# Patient Record
Sex: Male | Born: 1995 | Race: Black or African American | Hispanic: No | Marital: Single | Smoking: Never smoker
Health system: Southern US, Community
[De-identification: ages and names within clinical notes are randomized; demographics above are authoritative.]

## PROBLEM LIST (undated history)

## (undated) DIAGNOSIS — M79641 Pain in right hand: Secondary | ICD-10-CM

## (undated) DIAGNOSIS — S62306A Unspecified fracture of fifth metacarpal bone, right hand, initial encounter for closed fracture: Secondary | ICD-10-CM

## (undated) DIAGNOSIS — B20 Human immunodeficiency virus [HIV] disease: Secondary | ICD-10-CM

## (undated) DIAGNOSIS — K6289 Other specified diseases of anus and rectum: Secondary | ICD-10-CM

## (undated) HISTORY — PX: INGUINAL HERNIA REPAIR: SUR1180

---

## 2013-11-30 ENCOUNTER — Emergency Department (HOSPITAL_BASED_OUTPATIENT_CLINIC_OR_DEPARTMENT_OTHER): Payer: No Typology Code available for payment source

## 2013-11-30 ENCOUNTER — Encounter (HOSPITAL_BASED_OUTPATIENT_CLINIC_OR_DEPARTMENT_OTHER): Payer: Self-pay | Admitting: Emergency Medicine

## 2013-11-30 ENCOUNTER — Emergency Department (HOSPITAL_BASED_OUTPATIENT_CLINIC_OR_DEPARTMENT_OTHER)
Admission: EM | Admit: 2013-11-30 | Discharge: 2013-11-30 | Disposition: A | Discharge status: Home / Self Care | Payer: No Typology Code available for payment source | Attending: Emergency Medicine | Admitting: Emergency Medicine

## 2013-11-30 DIAGNOSIS — S0083XA Contusion of other part of head, initial encounter: Secondary | ICD-10-CM | POA: Insufficient documentation

## 2013-11-30 DIAGNOSIS — IMO0002 Reserved for concepts with insufficient information to code with codable children: Secondary | ICD-10-CM | POA: Insufficient documentation

## 2013-11-30 DIAGNOSIS — S0003XA Contusion of scalp, initial encounter: Secondary | ICD-10-CM | POA: Insufficient documentation

## 2013-11-30 DIAGNOSIS — Y92410 Unspecified street and highway as the place of occurrence of the external cause: Secondary | ICD-10-CM | POA: Insufficient documentation

## 2013-11-30 DIAGNOSIS — Y9389 Activity, other specified: Secondary | ICD-10-CM | POA: Insufficient documentation

## 2013-11-30 DIAGNOSIS — Z79899 Other long term (current) drug therapy: Secondary | ICD-10-CM | POA: Insufficient documentation

## 2013-11-30 DIAGNOSIS — S0093XA Contusion of unspecified part of head, initial encounter: Secondary | ICD-10-CM

## 2013-11-30 DIAGNOSIS — S161XXA Strain of muscle, fascia and tendon at neck level, initial encounter: Secondary | ICD-10-CM

## 2013-11-30 DIAGNOSIS — S1093XA Contusion of unspecified part of neck, initial encounter: Secondary | ICD-10-CM

## 2013-11-30 DIAGNOSIS — S139XXA Sprain of joints and ligaments of unspecified parts of neck, initial encounter: Secondary | ICD-10-CM | POA: Insufficient documentation

## 2013-11-30 MED ORDER — NAPROXEN 500 MG PO TABS: 500.0000 mg | ORAL_TABLET | Freq: Two times a day (BID) | ORAL | Status: DC
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
## 2013-11-30 NOTE — Discharge Instructions (Signed)
Your CT scan of your neck shows no injury to the cervical spine. The CT of your head shows no bleeding or injury inside your head. I am giving you instructions on head injury since you have the headache and the nausea. Return for worsening symptoms.

## 2013-12-04 NOTE — ED Provider Notes (Signed)
Medical screening examination/treatment/procedure(s) were performed by non-physician practitioner and as supervising physician I was immediately available for consultation/collaboration.   EKG Interpretation None        Dagmar HaitWilliam Marleni Gallardo, MD 12/04/13 [id number]

## 2014-06-12 HISTORY — PX: ORIF METACARPAL FRACTURE: SUR940

## 2014-07-01 ENCOUNTER — Emergency Department (HOSPITAL_COMMUNITY): Payer: BC Managed Care – PPO

## 2014-07-01 ENCOUNTER — Emergency Department (INDEPENDENT_AMBULATORY_CARE_PROVIDER_SITE_OTHER)
Admission: EM | Admit: 2014-07-01 | Discharge: 2014-07-01 | Disposition: A | Discharge status: Home / Self Care | Payer: BC Managed Care – PPO | Source: Home / Self Care

## 2014-07-01 ENCOUNTER — Encounter (HOSPITAL_COMMUNITY): Payer: Self-pay | Admitting: Emergency Medicine

## 2014-07-01 ENCOUNTER — Emergency Department (INDEPENDENT_AMBULATORY_CARE_PROVIDER_SITE_OTHER): Payer: BC Managed Care – PPO

## 2014-07-01 DIAGNOSIS — S62329A Displaced fracture of shaft of unspecified metacarpal bone, initial encounter for closed fracture: Secondary | ICD-10-CM

## 2014-07-01 MED ORDER — LIDOCAINE HCL (PF) 1 % IJ SOLN
INTRAMUSCULAR | Status: AC
  Filled 2014-07-01: qty 10

## 2014-07-01 MED ORDER — HYDROCODONE-ACETAMINOPHEN 5-325 MG PO TABS: 1.0000 | ORAL_TABLET | ORAL | Status: DC | PRN

## 2014-07-01 MED ORDER — HYDROCODONE-ACETAMINOPHEN 5-325 MG PO TABS
2.0000 | ORAL_TABLET | Freq: Once | ORAL | Status: AC
  Administered 2014-07-01: 2 via ORAL

## 2014-07-01 MED ORDER — HYDROCODONE-ACETAMINOPHEN 5-325 MG PO TABS
ORAL_TABLET | ORAL | Status: AC
  Filled 2014-07-01: qty 2

## 2014-07-01 NOTE — Consult Note (Signed)
  See full consult# [id number] Francisco Ryser MD

## 2014-07-01 NOTE — ED Provider Notes (Signed)
CSN: [id number]     Arrival date & time 07/01/14  1049 History   First MD Initiated Contact with Patient 07/01/14 1105     Chief Complaint  Patient presents with  . Hand Injury   (Consider location/radiation/quality/duration/timing/severity/associated sxs/prior Treatment) HPI Comments: C/O right hand pain after punching a wall in anger last PM.   History reviewed. No pertinent past medical history. Past Surgical History  Procedure Laterality Date  . Hernia repair     History reviewed. No pertinent family history. History  Substance Use Topics  . Smoking status: Never Smoker   . Smokeless tobacco: Not on file  . Alcohol Use: Yes    Review of Systems  Constitutional: Negative.   Respiratory: Negative for cough and shortness of breath.   Cardiovascular: Negative for chest pain.  Gastrointestinal: Negative.   Musculoskeletal:       As per HPI  Skin: Negative.   Neurological: Negative.     Allergies  Review of patient's allergies indicates no known allergies.  Home Medications   Prior to Admission medications   Medication Sig Start Date End Date Taking? Authorizing Provider  fexofenadine (ALLEGRA) 180 MG tablet Take 180 mg by mouth daily.    Historical Provider, MD  naproxen (NAPROSYN) 500 MG tablet Take 1 tablet (500 mg total) by mouth 2 (two) times daily. 11/30/13   Hope Orlene OchM Neese, NP   BP 125/79  Pulse 61  Temp(Src) 98.3 F (36.8 C) (Oral)  Resp 16  SpO2 95% Physical Exam  Nursing note and vitals reviewed. Constitutional: He is oriented to person, place, and time. He appears well-developed and well-nourished.  HENT:  Head: Normocephalic and atraumatic.  Eyes: EOM are normal. Left eye exhibits no discharge.  Neck: Normal range of motion. Neck supple.  Pulmonary/Chest: Effort normal. No respiratory distress.  Musculoskeletal:  Swelling and tenderness to the R. Hand, greatest over the 4th and 5th MT's.  No deformity. Distal N/V, M/S intact. Digital flexion and  extension intact. Cap reill <2 sec.  Wrist nontender with full ROM.  Radial pulse 2+  Neurological: He is alert and oriented to person, place, and time. No cranial nerve deficit.  Skin: Skin is warm and dry.  Psychiatric: He has a normal mood and affect.    ED Course  Procedures (including critical care time) Labs Review Labs Reviewed - No data to display  Imaging Review Dg Hand Complete Right  07/01/2014   CLINICAL DATA:  Patient reports punching a wall last night now with persistent pain and swelling over the third through fifth metacarpal regions  EXAM: RIGHT HAND - COMPLETE 3+ VIEW  COMPARISON:  None.  FINDINGS: There is an acute angulated fracture of the distal shaft of the fifth metacarpal. The first through fourth metacarpals are intact as are the phalanges. There is overlying soft tissue swelling. The carpometacarpal, metacarpophalangeal, and interphalangeal joints are intact.  IMPRESSION: There is an angulated fracture of the distal third of the shaft of the fifth metacarpal.   Electronically Signed   By: [facility] [place]  [place]   On: 07/01/2014 11:55     MDM   1. Shaft of metacarpal bone, fracture, closed, initial encounter    Norco 5 mg x 2 tabs po once. Spoke with Dr. Amanda PeaGramig at [phone] and advised of the xray interpretation. [facility]. Post reduction films ordered after management per Dr. Amanda PeaGramig. Dr. Brunetta GeneraGramigs office to call pt to arrange for surgery next week Care instructions  Pain management per Dr. Amanda PeaGramig.     Hayden Rasmussenavid Leslyn Monda, NP 07/01/14 1352

## 2014-07-01 NOTE — Discharge Instructions (Signed)
Cast or Splint Care °Casts and splints support injured limbs and keep bones from moving while they heal. It is important to care for your cast or splint at home.   °HOME CARE INSTRUCTIONS °· Keep the cast or splint uncovered during the drying period. It can take 24 to 48 hours to dry if it is made of plaster. A fiberglass cast will dry in less than 1 hour. °· Do not rest the cast on anything harder than a pillow for the first 24 hours. °· Do not put weight on your injured limb or apply pressure to the cast until your health care provider gives you permission. °· Keep the cast or splint dry. Wet casts or splints can lose their shape and may not support the limb as well. A wet cast that has lost its shape can also create harmful pressure on your skin when it dries. Also, wet skin can become infected. °¨ Cover the cast or splint with a plastic bag when bathing or when out in the rain or snow. If the cast is on the trunk of the body, take sponge baths until the cast is removed. °¨ If your cast does become wet, dry it with a towel or a blow dryer on the cool setting only. °· Keep your cast or splint clean. Soiled casts may be wiped with a moistened cloth. °· Do not place any hard or soft foreign objects under your cast or splint, such as cotton, toilet paper, lotion, or powder. °· Do not try to scratch the skin under the cast with any object. The object could get stuck inside the cast. Also, scratching could lead to an infection. If itching is a problem, use a blow dryer on a cool setting to relieve discomfort. °· Do not trim or cut your cast or remove padding from inside of it. °· Exercise all joints next to the injury that are not immobilized by the cast or splint. For example, if you have a long leg cast, exercise the hip joint and toes. If you have an arm cast or splint, exercise the shoulder, elbow, thumb, and fingers. °· Elevate your injured arm or leg on 1 or 2 pillows for the first 1 to 3 days to decrease  swelling and pain. It is best if you can comfortably elevate your cast so it is higher than your heart. °SEEK MEDICAL CARE IF:  °· Your cast or splint cracks. °· Your cast or splint is too tight or too loose. °· You have unbearable itching inside the cast. °· Your cast becomes wet or develops a soft spot or area. °· You have a bad smell coming from inside your cast. °· You get an object stuck under your cast. °· Your skin around the cast becomes red or raw. °· You have new pain or worsening pain after the cast has been applied. °SEEK IMMEDIATE MEDICAL CARE IF:  °· You have fluid leaking through the cast. °· You are unable to move your fingers or toes. °· You have discolored (blue or white), cool, painful, or very swollen fingers or toes beyond the cast. °· You have tingling or numbness around the injured area. °· You have severe pain or pressure under the cast. °· You have any difficulty with your breathing or have shortness of breath. °· You have chest pain. °Document Released: 08/26/2000 Document Revised: 06/19/2013 Document Reviewed: 03/07/2013 °ExitCare® Patient Information ©2015 [employer]. This information is not intended to replace advice given to you by your health care   provider. Make sure you discuss any questions you have with your health care provider.  Boxer's Fracture You have a break (fracture) of the fifth metacarpal bone. This is commonly called a boxer's fracture. This is the bone in the hand where the little finger attaches. The fracture is in the end of that bone, closest to the little finger. It is usually caused when you hit an object with a clenched fist. Often, the knuckle is pushed down by the impact. Sometimes, the fracture rotates out of position. A boxer's fracture will usually heal within 6 weeks, if it is treated properly and protected from re-injury. Surgery is sometimes needed. A cast, splint, or bulky hand dressing may be used to protect and immobilize a boxer's fracture. Do  not remove this device or dressing until your caregiver approves. Keep your hand elevated, and apply ice packs for 15-20 minutes every 2 hours, for the first 2 days. Elevation and ice help reduce swelling and relieve pain. See your caregiver, or an orthopedic specialist, for follow-up care within the next 10 days. This is to make sure your fracture is healing properly. Document Released: 08/29/2005 Document Revised: 11/21/2011 Document Reviewed: 02/16/2007 [facility] Patient Information 2015 [place], [place]. This information is not intended to replace advice given to you by your health care provider. Make sure you discuss any questions you have with your health care provider.  Closed Reduction for Metacarpal Fracture or Dislocation, Care After Refer to this sheet in the next few weeks. These instructions provide you with information on caring for yourself after your procedure. Your health care provider may also give you more specific instructions. Your treatment has been planned according to current medical practices, but problems sometimes occur. Call your health care provider if you have any problems or questions after your procedure. HOME CARE INSTRUCTIONS   Only take medicines as directed by your health care provider.  Keep the injured area elevated above the level of your heart while you are sleeping or sitting down.  Apply ice to the injured area, twice per day, for 2-3 days:  Put ice in a plastic bag.  Place a towel between your skin and the bag.  Leave the ice on for 15 minutes.  Keep your splint clean and dry. Cover your splint with a bag while you shower. SEEK MEDICAL CARE IF:  Your pain does not get better or gets worse.  You develop numbness or tingling.  Your fingers turn white or bluish. Document Released: 05/23/2012 Document Revised: 01/13/2014 Document Reviewed: 05/23/2012 [facility] Patient Information 2015 [place], [place]. This information is not intended to replace advice  given to you by your health care provider. Make sure you discuss any questions you have with your health care provider.  Hand Fracture, Fifth Metacarpal The small metacarpal is the bone at the base of the little finger between the knuckle and the wrist. A fracture is a break in that bone. One of the fractures that is common to this bone is called a Boxer's Fracture. TREATMENT These fractures can be treated with:   Reduction (bones moved back into place), then pinned through the skin to maintain the position, and then casted for about 6 weeks or as your caregiver determines necessary.  ORIF (open reduction and internal fixation) - the fracture site is opened and the bone pieces are fixed into place with pins and then casted for approximately 6 weeks or as your caregiver determines necessary. Your caregiver will discuss the type of fracture you have and  the treatment that should be best for that problem. If surgery is the treatment of choice, the following is information for you to know, and also let your caregiver know about prior to surgery.  LET YOUR CAREGIVER KNOW ABOUT:  Allergies.  Medications taken including herbs, eye drops, over the counter medications, and creams.  Use of steroids (by mouth or creams).  Previous problems with anesthetics or novocaine.  Possibility of pregnancy, if this applies.  History of blood clots (thrombophlebitis).  History of bleeding or blood problems.  Previous surgery.  Other health problems. AFTER THE PROCEDURE After surgery, you will be taken to the recovery area where a nurse will watch and check your progress. Once you're awake, stable, and taking fluids well, barring other problems you'll be allowed to go home. Once home an ice pack applied to your operative site may help with discomfort and keep the swelling down. HOME CARE INSTRUCTIONS   Follow your caregiver's instructions as to activities, exercises, physical therapy, and driving a  car.  Daily exercise is helpful for maintaining range of motion (movement and mobility) and strength. Exercise as instructed.  To lessen swelling, keep the injured hand elevated above the level of your heart as much as possible.  Apply ice to the injury for 15-20 minutes each hour while awake for the first 2 days. Put the ice in a plastic bag and place a thin towel between the bag of ice and your cast.  Move the fingers of your casted hand at least several times a day.  If a plaster or fiberglass cast was applied:  Do not try to scratch the skin under the cast using a sharp or pointed object.  Check the skin around the cast every day. You may put lotion on red or sore areas.  Keep your cast dry. Your cast can be protected during bathing with a plastic bag. Do not put your cast into the water.  If a plaster splint was applied:  Wear the splint for as long as directed by your caregiver or until seen for follow-up examination.  Do not get your splint wet. Protect it during bathing with a plastic bag.  You may loosen the elastic bandage around the splint if your fingers start to get numb, tingle, get cold or turn blue.  Do not put pressure on your cast or splint; this may cause it to break. Especially, do not lean plaster casts on hard surfaces for 24 hours after application.  Take medications as directed by your caregiver.  Only take over-the-counter or prescription medicines for pain, discomfort, or fever as directed by your caregiver.  Follow all instructions for physician referrals, physical therapy, and rehabilitation. Any delay in obtaining necessary care could result in permanent injury, disability and chronic pain. SEEK MEDICAL CARE IF:   Increased bleeding (more than a small spot) from the wound or from beneath your cast or splint if there is a wound beneath the cast from surgery.  Redness, swelling, or increasing pain in the wound or from beneath your cast or splint.  Pus  coming from wound or from beneath your cast or splint.  An unexplained oral temperature above 102 F (38.9 C) develops.  A foul smell coming from the wound or dressing or from beneath your cast or splint.  You are unable to move your little finger. SEEK IMMEDIATE MEDICAL CARE IF:  You develop a rash, have difficulty breathing, or have any allergy problems. If you do not have a window  in your cast for observing the wound, a discharge or minor bleeding may show up as a stain on the outside of your cast. Report these findings to your caregiver. MAKE SURE YOU:   Understand these instructions.  Will watch your condition.  Will get help right away if you are not doing well or get worse. Document Released: 12/05/2000 Document Revised: 11/21/2011 Document Reviewed: 04/17/2008 [facility] Patient Information 2015 [place]. This information is not intended to replace advice given to you by your health care provider. Make sure you discuss any questions you have with your health care provider.

## 2014-07-01 NOTE — ED Notes (Signed)
Reports injuring right hand last night after punching a wall.  Having pain and swelling in fingers.  Pain is felt on top of hand near the four and fifth fingers.

## 2014-07-02 NOTE — Consult Note (Signed)
[name]:  Vos, Calvin             ACCOUNT NO.:  [id number]  MEDICAL RECORD NO.:  [id number]  LOCATION:  [facility]06                         FACILITY:  [facility]  PHYSICIAN:  Dionne AnoWilliam M. Lorae Roig, M.D.DATE OF BIRTH:  03/13/1996  DATE OF CONSULTATION: DATE OF DISCHARGE:  07/01/2014                                CONSULTATION   I had the pleasure to see [facility].  The patient had a hand injury.  Late last night, he had an injury to the fifth metacarpal.  He sustained a fifth metacarpal shaft fracture with severe displacement, swelling, and abnormality.  I have discussed him in his upper extremity predicament.  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  He is a very nice young man.  PAST SURGICAL HISTORY:  Hernia repair.  PAST MEDICAL HISTORY:  None significant.  SOCIAL HISTORY:  He does not smoke.  He occasionally enjoys an alcoholic beverage.  REVIEW OF SYSTEMS:  Negative.  ALLERGIES:  The patient has no known drug allergies.  HOME MEDICINES:  Naproxen p.r.n. and Allegra.  PHYSICAL EXAMINATION:  GENERAL:  Pleasant male, alert and oriented and in no acute distress. VITAL SIGNS:  Stable.  The patient has full range of motion about the lower extremities. ABDOMEN:  Nontender, nondistended. HEENT:  Within normal limits. CHEST:  Clear with equal expansion. HEART:  Regular rate. EXTREMITIES:  Right upper extremity has small abrasion, no open injury and a tremendously swollen area.  There is no evidence of infection, dystrophy, or vascular compromise.  His x-rays were reviewed, which do show a displaced fifth metacarpal shaft fracture, apex dorsal angulation.  I discussed with the patient all issues.  Following this, I consented him verbally for a ulnar nerve block.  A 20 mL of lidocaine 1% without epinephrine.  I performed an ulnar nerve and dorsal ulnar sensory block.  Then, I scrubbed the hand with Hibiclens scrub and paint,  washed him and placed Xeroform over the abrasions followed by closed reduction and application of short-arm cast, well-molded with 3-point mold.  Following this, 4 views of the hand showed much improved alignment, but he still has some degree of apex dorsal angulation and due to this and his tendency with a transverse fracture for progressive angulation, I would recommend pinning/fixation definitively.  I have attempted to call his mother at telephone number [phone]. Her name is Bollisa.  I discussed this with his mother who lives in [place] and we are going to set up a time for elective ORIF.  At present time, he is alert and oriented and in no acute distress.  We discharged him on OxyIR 5 mg 1-2 q.4-6 hours p.r.n. pain p.o., Peri- Colace, vitamin C, ice elevate, keep the area clean and dry and I have his phone number to arrange for proper followup and definitive fixation in a mutually convenient time.  It was pleasure to see him today and discussed him his care.  We look forward to participating in his postinjury recovery process and do recommend definitive surgical fixation given the fact that he is a young, active male and right-hand dominant.  FINAL DIAGNOSIS:  Status post closed reduction  under ulnar nerve block of a right fifth metacarpal fracture.     Dionne AnoWilliam M. Amanda PeaGramig, M.D.     [facility]  D:  07/01/2014  T:  07/02/2014  Job:  [id number]

## 2014-07-04 NOTE — ED Provider Notes (Signed)
Medical screening examination/treatment/procedure(s) were performed by resident physician or non-physician practitioner and as supervising physician I was immediately available for consultation/collaboration.   KINDL,JAMES DOUGLAS MD.   James D Kindl, MD 07/04/14 1005 

## 2015-07-26 ENCOUNTER — Emergency Department (HOSPITAL_BASED_OUTPATIENT_CLINIC_OR_DEPARTMENT_OTHER)
Admission: EM | Admit: 2015-07-26 | Discharge: 2015-07-26 | Disposition: A | Discharge status: Home / Self Care | Payer: BLUE CROSS/BLUE SHIELD | Attending: Emergency Medicine | Admitting: Emergency Medicine

## 2015-07-26 ENCOUNTER — Encounter (HOSPITAL_BASED_OUTPATIENT_CLINIC_OR_DEPARTMENT_OTHER): Payer: Self-pay | Admitting: Emergency Medicine

## 2015-07-26 DIAGNOSIS — K921 Melena: Secondary | ICD-10-CM | POA: Insufficient documentation

## 2015-07-26 DIAGNOSIS — K6289 Other specified diseases of anus and rectum: Secondary | ICD-10-CM | POA: Diagnosis present

## 2015-07-26 DIAGNOSIS — K602 Anal fissure, unspecified: Secondary | ICD-10-CM

## 2015-07-26 DIAGNOSIS — Z9889 Other specified postprocedural states: Secondary | ICD-10-CM | POA: Diagnosis not present

## 2015-07-26 DIAGNOSIS — K600 Acute anal fissure: Secondary | ICD-10-CM | POA: Insufficient documentation

## 2015-07-26 MED ORDER — LIDOCAINE HCL 2 % EX GEL
1.0000 "application " | Freq: Once | CUTANEOUS | Status: AC
  Administered 2015-07-26: 1
  Filled 2015-07-26: qty 20

## 2015-07-26 MED ORDER — LIDOCAINE 5 % EX OINT: 1.0000 "application " | TOPICAL_OINTMENT | Freq: Four times a day (QID) | CUTANEOUS | Status: DC | PRN

## 2015-07-26 MED ORDER — LIDOCAINE VISCOUS 2 % MT SOLN: 20.0000 mL | Freq: Once | OROMUCOSAL | Status: DC

## 2015-07-26 MED ORDER — DOCUSATE SODIUM 100 MG PO CAPS
100.0000 mg | ORAL_CAPSULE | Freq: Once | ORAL | Status: AC
  Administered 2015-07-26: 100 mg via ORAL
  Filled 2015-07-26: qty 1

## 2015-07-26 MED ORDER — SENNOSIDES-DOCUSATE SODIUM 8.6-50 MG PO TABS: 2.0000 | ORAL_TABLET | Freq: Every evening | ORAL | Status: DC | PRN

## 2015-07-26 NOTE — ED Notes (Signed)
C/o rectal pain, constant, "cannot stand", worse with sitting and BMs, reports constipation, scant blood noted on tissue, hemorrhoids, (denies: nvd, fever, abd or back pain, numbness or tingling, or other sx), describes diet as poor, well hydrated (pt is a student-athlete), lifts weights. Pt alert, NAD, calm, interactive, [facility].

## 2015-07-26 NOTE — ED Provider Notes (Signed)
CSN: [id number]     Arrival date & time 07/26/15  2246 History   First MD Initiated Contact with Patient 07/26/15 2250     Chief Complaint  Patient presents with  . Rectal Pain     (Consider location/radiation/quality/duration/timing/severity/associated sxs/prior Treatment) HPI  Blood pressure 141/93, pulse 68, temperature 98 F (36.7 C), temperature source Oral, resp. rate 18, height 6\' 3"  (1.905 m), weight 175 lb (79.379 kg), SpO2 100 %.  Liz MaladyDreshaun Delight OvensJarmon is a 19 y.o. male complaining of rectal pain course of 4 days. Worse with bowel movements. He has blood-streaked stool. Patient has been using Preparation H with little relief. Has firm stool. No fever, chills, abdominal pain, similar prior episodes.  History reviewed. No pertinent past medical history. Past Surgical History  Procedure Laterality Date  . Hernia repair     History reviewed. No pertinent family history. Social History  Substance Use Topics  . Smoking status: Never Smoker   . Smokeless tobacco: None  . Alcohol Use: Yes    Review of Systems  10 systems reviewed and found to be negative, except as noted in the HPI.   Allergies  Review of patient's allergies indicates no known allergies.  Home Medications   Prior to Admission medications   Medication Sig Start Date End Date Taking? Authorizing Provider  fexofenadine (ALLEGRA) 180 MG tablet Take 180 mg by mouth daily.    Historical Provider, MD  naproxen (NAPROSYN) 500 MG tablet Take 1 tablet (500 mg total) by mouth 2 (two) times daily. 11/30/13   Hope Orlene OchM Neese, NP   BP 141/93 mmHg  Pulse 68  Temp(Src) 98 F (36.7 C) (Oral)  Resp 18  Ht 6\' 3"  (1.905 m)  Wt 175 lb (79.379 kg)  BMI 21.87 kg/m2  SpO2 100% Physical Exam  Constitutional: He is oriented to person, place, and time. He appears well-developed and well-nourished. No distress.  HENT:  Head: Normocephalic.  Eyes: Conjunctivae and EOM are normal.  Cardiovascular: Normal rate.     Pulmonary/Chest: Effort normal. No stridor.  Genitourinary:     Digital rectal exam chaperoned by nurse Jonny RuizJohn:  Small condyloma scattered    Musculoskeletal: Normal range of motion.  Neurological: He is alert and oriented to person, place, and time.  Psychiatric: He has a normal mood and affect.  Nursing note and vitals reviewed.   ED Course  Procedures (including critical care time) Labs Review Labs Reviewed - No data to display  Imaging Review No results found. I have personally reviewed and evaluated these images and lab results as part of my medical decision-making.   EKG Interpretation None      MDM   Final diagnoses:  Anal fissure    Filed Vitals:   07/26/15 2253  BP: 141/93  Pulse: 68  Temp: 98 F (36.7 C)  TempSrc: Oral  Resp: 18  Height: 6\' 3"  (1.905 m)  Weight: 175 lb (79.379 kg)  SpO2: 100%    Medications  lidocaine (XYLOCAINE) 2 % jelly 1 application (1 application Other Given 07/26/15 2324)  docusate sodium (COLACE) capsule 100 mg (100 mg Oral Given 07/26/15 2339)    Rosilyn MingsDreshaun Snider is 19 y.o. male presenting with painful rectum and blood streaked stool. PE with fissure and small condyloma. General surgery referring given.  Evaluation does not show pathology that would require ongoing emergent intervention or inpatient treatment. Pt is hemodynamically stable and mentating appropriately. Discussed findings and plan with patient/guardian, who agrees with care plan. All questions answered. Return precautions  discussed and outpatient follow up given.   Discharge Medication List as of 07/26/2015 11:35 PM    START taking these medications   Details  lidocaine (XYLOCAINE) 5 % ointment Apply 1 application topically 4 (four) times daily as needed., Starting 07/26/2015, Until Discontinued, Print    senna-docusate (SENOKOT-S) 8.6-50 MG tablet Take 2 tablets by mouth at bedtime as needed for mild constipation., Starting 07/26/2015, Until Discontinued,  Print             Wynetta Emery, PA-C 07/26/15 2354  Paula Libra, MD 07/27/15 [phone]

## 2015-07-26 NOTE — Discharge Instructions (Signed)
Do not hesitate to return to the emergency room for any new, worsening or concerning symptoms.  Please obtain primary care using resource guide below. Let them know that you were seen in the emergency room and that they will need to obtain records for further outpatient management.     Anal Fissure, Adult An anal fissure is a small tear or crack in the skin around the anus. Bleeding from a fissure usually stops on its own within a few minutes. However, bleeding will often occur again with each bowel movement until the crack heals. CAUSES This condition may be caused by:  Passing large, hard stool (feces).  Frequent diarrhea.  Constipation.  Inflammatory bowel disease (Crohn disease or ulcerative colitis).  Infections.  Anal sex. SYMPTOMS Symptoms of this condition include:  Bleeding from the rectum.  Small amounts of blood seen on your stool, on toilet paper, or in the toilet after a bowel movement.  Painful bowel movements.  Itching or irritation around the anus. DIAGNOSIS A health care provider may diagnose this condition by closely examining the anal area. An anal fissure can usually be seen with careful inspection. In some cases, a rectal exam may be performed, or a short tube (anoscope) may be used to examine the anal canal. TREATMENT Treatment for this condition may include:  Taking steps to avoid constipation. This may include making changes to your diet, such as increasing your intake of fiber or fluid.  Taking fiber supplements. These supplements can soften your stool to help make bowel movements easier. Your health care provider may also prescribe a stool softener if your stool is often hard.  Taking sitz baths. This may help to heal the tear.  Using medicated creams or ointments. These may be prescribed to lessen discomfort. HOME CARE INSTRUCTIONS Eating and Drinking  Avoid foods that may be constipating, such as bananas and dairy products.  Drink enough  fluid to keep your urine clear or pale yellow.  Maintain a diet that is high in fruits, whole grains, and vegetables. General Instructions  Keep the anal area as clean and dry as possible.  Take sitz baths as told by your health care provider. Do not use soap in the sitz baths.  Take over-the-counter and prescription medicines only as told by your health care provider.  Use creams or ointments only as told by your health care provider.  Keep all follow-up visits as told by your health care provider. This is important. SEEK MEDICAL CARE IF:  You have more bleeding.  You have a fever.  You have diarrhea that is mixed with blood.  You continue to have pain.  Your problem is getting worse rather than better.   This information is not intended to replace advice given to you by your health care provider. Make sure you discuss any questions you have with your health care provider.   Document Released: 08/29/2005 Document Revised: 05/20/2015 Document Reviewed: 11/24/2014 Elsevier Interactive Patient Education 2016 [employer] Emergency Department Resource Guide 1) Find a Doctor and Pay Out of Pocket Although you won't have to find out who is covered by your insurance plan, it is a good idea to ask around and get recommendations. You will then need to call the office and see if the doctor you have chosen will accept you as a new patient and what types of options they offer for patients who are self-pay. Some doctors offer discounts or will set up payment plans for their patients who do not have  insurance, but you will need to ask so you aren't surprised when you get to your appointment.  2) Contact Your Local Health Department Not all health departments have doctors that can see patients for sick visits, but many do, so it is worth a call to see if yours does. If you don't know where your local health department is, you can check in your phone book. The CDC also has a tool to help you  locate your state's health department, and many state websites also have listings of all of their local health departments.  3) Find a Walk-in Clinic If your illness is not likely to be very severe or complicated, you may want to try a walk in clinic. These are popping up all over the country in pharmacies, drugstores, and shopping centers. They're usually staffed by nurse practitioners or physician assistants that have been trained to treat common illnesses and complaints. They're usually fairly quick and inexpensive. However, if you have serious medical issues or chronic medical problems, these are probably not your best option.  No Primary Care Doctor: - Call Health Connect at  [phone] - they can help you locate a primary care doctor that  accepts your insurance, provides certain services, etc. - Physician Referral Service- [phone]  Chronic Pain Problems: Organization         Address  Phone   Notes  [facility]  [phone] Patients need to be referred by their primary care doctor.   Medication Assistance: Organization         Address  Phone   Notes  [facility] Medication [facility] [address] [place], [place] [address] [phone] --Must be a resident of [facility] -- Must have NO insurance coverage whatsoever (no Medicaid/ Medicare, etc.) -- The pt. MUST have a primary care doctor that directs their care regularly and follows them in the community   Med[facility]ist  [phone]   [employer]  [phone]    Agencies that provide inexpensive medical care: Organization         Address  Phone   Notes  [facility]  [phone]   [facility]    [phone]   [facility] [address] [place], [place] [address] [phone]   [facility] [address] [place] [place] [phone]   Planned Parenthood    [phone]   [facility]    [phone]   [facility]  [address], [place] Phone:  [phone], Fax:  [phone] Hours of Operation:  9 am - 6 pm, M-F.  Also accepts Medicaid/Medicare and self-pay.  [facility]  [address], [place] Phone: [phone], Fax: [phone]. Hours of Operation:  8:30 am - 5:30 pm, M-F.  Also accepts Medicaid and self-pay.  [facility] [place] [address], [place] Phone: [phone]   [facility] [address] Natasha Bence [place], [place] [phone], Ext. [phone] Mondays & Thursdays: 7-9 AM.  First 15 patients are seen on a first come, first serve basis.    Medicaid-accepting [facility] Providers:  Organization         Address  Phone   Notes  [facility] [address], [place] [phone] Also accepts self-pay patients.  [facility] [address]  Dolores Patty [place], [place]  [phone]   [place], [address], [place] [phone]   [place] [address] [place] [phone]   Lucianne Lei [address], [place]   [phone] Only accepts [place] Access [place] patients after they have their name applied to their card.   Self-Pay (no insurance) in [facility]:  Organization         Address  Phone   Notes  Sickle Cell Patients, [facility] [place] [phone]   [facility] [place] [phone]   [facility] [place]  [place], [place], [place] [phone]   [facility]/Dr. Osei-Bonsu  [address] [place] or [address], [place] [phone] Phone number for both [place] and [place] locations is the same.  [facility] and [facility] [address]  [place] [phone]   [facility] [address] [place] or [address] [phone]   [facility] [address], [place] [phone], phone; [phone], fax Sees patients 1st and 3rd Saturday of every month.  Must not qualify for public or private insurance (i.e. Medicaid, Medicare, [place] Health Choice, Veterans' Benefits)  Household income should be no more than 200% of the poverty level The clinic cannot treat you if you are pregnant or think you are pregnant  Sexually transmitted diseases are not treated at the clinic.    Dental Care: Organization         Address  Phone  Notes  [facility] Department of [facility] [place] [phone] Accepts children up to age 52 who are enrolled in [place] or [place]; pregnant women with a Medicaid card; and children who have applied for Medicaid or [place] Health Choice, but were declined, whose parents can pay a reduced fee at time of service.  [facility] of [facility]  [address], [place] [phone] Accepts children up to age 9 who are enrolled in [place] or [place]; pregnant women with a Medicaid card; and children who have applied for Medicaid or [place] Health Choice, but were declined, whose parents can pay a reduced fee at time of service.  [facility] Adult [facility] Access PROGRAM  [place] [phone] Patients are seen by appointment only. Walk-ins are not accepted. [place] will see patients 45 years of age and older. Monday - Tuesday (8am-5pm) Most Wednesdays (8:30-5pm) $30 per visit, cash only  [facility] Access PROGRAM  [address], [place] [facility] [phone] Patients are seen by appointment only. Walk-ins are not accepted. [place] will see patients 59 years of age and older. One Wednesday Evening  (Monthly: Volunteer Based).  $30 per visit, cash only  [place]  [phone] for adults; Children under age 92, call Graduate Pediatric Dentistry at [phone]. Children aged 38-14, please call [phone] to request a pediatric application.  Dental services are provided in all areas of dental care including fillings, crowns and bridges, complete and partial dentures, implants, gum treatment, root canals, and extractions. Preventive care is also provided. Treatment is provided to both adults and children. Patients are selected via  a lottery and there is often a waiting list.   [facility] [address] [place]  [phone] [email or web address]   [facility] [address] [place], [place] [phone], Ext. [phone] Second and Fourth Thursday of each month, opens at 6:30 AM; Clinic ends at 9 AM.  Patients are seen on a first-come first-served basis, and a limited number are seen during each clinic.   [facility]  [address] Hillard Danker [place], [place] [phone]   Eligibility Requirements You must have lived in [place], [place], or [place] counties for at least the last three months.   You cannot be eligible for state or federal sponsored [employer], including [employer], [place], or [employer].   You generally cannot be eligible for healthcare insurance through your employer.    How to apply: Eligibility screenings are held every Tuesday and Wednesday afternoon from 1:00 pm until 4:00 pm. You do not need an appointment for the interview!  [facility] [address] [place], [place]   [place]  [place] Department  [place]  [phone]    Behavioral Health Resources in the Community: Intensive Outpatient Programs Organization         Address  Phone  Notes  [facility] [place]. [address] [place], [place] [phone]   [facility] Outpatient [address] [place], [place]   ADS: Alcohol & Drug S[facility] [address] [place], [place]   [place] [address]  [place], [place] or [phone]   Substance Abuse Resources Organization         Address  Phone  Notes  Alcohol and Drug Services  [phone]   [place]  [phone]   [facility] [place]   Chinita Pester  [phone]   Residential & Outpatient Substance Abuse Program  [phone]   Psychological Services Organization         Address  Phone  Notes  [facility] [place]  [place]  [phone]   [place] [address] [place] or [phone]    Mobile Crisis Teams Organization         Address  Phone  Notes  [facility], Mobile Crisis Care Unit  [phone]   [facility]  [address] [place], [place]   [place] Levels [address] [place] [phone]    Self-Help/Support Groups Organization         Address  Phone             Notes  [place]. of [place] - variety of support groups  [place] [facility] [place] Call for more information  Narcotics Anonymous (NA), [facility] [address], [employer] [place]  2 meetings at this location   Special educational needs teacher         Address  Phone  Notes  ASAP Residential Treatment [place],    [place]  [phone]   [facility]  [address] [place] [phone], [place], [place]   [place], [place] [phone] Admissions: 8am-3pm M-F  Incentives Substance [place] [address],    [place], [place] [phone]   [facility] [place] [address],  [place], [place]   [facility] [address],  [place], [place]   [facility] - Intensive Outpatient [address] Kristeen [address], [place], [place]   [facility] [place].,  [place], [place] [phone] or [phone]   [facility] Services (RTS) [address] [place], [place] Accepts Medicaid  [facility] [place] [address],  [place] [phone] Substance Abuse/Addiction Treatment   [facility] Organization         Address  Phone  Notes  [facility]  [phone]   Domenic Schwab, PhD [address] Arlis Porta [place], [place]   [phone] or [phone]   Lyman Niederwald [place], [place] [phone]   [facility] [address], [place], [place] [phone] Insurance/Medicaid/sponsorship through [facility] and Families [address]                                    [place], [place] [phone] [place] [address], [place] [phone]    Dr. Adele Schilder  [phone]   [facility] Dept. 1) [address] [place] 2) [place] 3)  [place] [address], [place] [phone]  [phone]   [place] [phone] or [phone] (After Hours)

## 2015-07-26 NOTE — ED Notes (Signed)
"  feel about the same", denies questions or needs, given Rx x2

## 2015-07-26 NOTE — ED Notes (Signed)
Pt in c/o rectal pain on bowel movements, sitting, and any contact. Has also noted mild bleeding on bowel movements.

## 2015-07-26 NOTE — ED Notes (Signed)
[facility]

## 2015-11-05 ENCOUNTER — Ambulatory Visit: Payer: Self-pay | Admitting: *Deleted

## 2015-11-05 ENCOUNTER — Ambulatory Visit (INDEPENDENT_AMBULATORY_CARE_PROVIDER_SITE_OTHER): Payer: Self-pay

## 2015-11-05 DIAGNOSIS — B20 Human immunodeficiency virus [HIV] disease: Secondary | ICD-10-CM

## 2015-11-05 DIAGNOSIS — Z23 Encounter for immunization: Secondary | ICD-10-CM

## 2015-11-05 DIAGNOSIS — F4323 Adjustment disorder with mixed anxiety and depressed mood: Secondary | ICD-10-CM

## 2015-11-05 LAB — COMPLETE METABOLIC PANEL WITH GFR
ALT: 22 U/L (ref 9–46)
AST: 21 U/L (ref 10–40)
Albumin: 3.5 g/dL — ABNORMAL LOW (ref 3.6–5.1)
Alkaline Phosphatase: 99 U/L (ref 40–115)
BUN: 9 mg/dL (ref 7–25)
CALCIUM: 8.8 mg/dL (ref 8.6–10.3)
CHLORIDE: 103 mmol/L (ref 98–110)
CO2: 29 mmol/L (ref 20–31)
Creat: 0.92 mg/dL (ref 0.60–1.35)
GFR, Est African American: >89 mL/min (ref >=60)
GFR, Est Non African American: >89 mL/min (ref >=60)
Glucose, Bld: 73 mg/dL (ref 65–99)
POTASSIUM: 3.8 mmol/L (ref 3.5–5.3)
Sodium: 138 mmol/L (ref 135–146)
Total Bilirubin: 0.4 mg/dL (ref 0.2–1.2)
Total Protein: 8.1 g/dL (ref 6.1–8.1)

## 2015-11-05 LAB — CBC WITH DIFFERENTIAL/PLATELET
Basophils Absolute: 0 10*3/uL (ref 0.0–0.1)
Basophils Relative: 0 % (ref 0–1)
EOS ABS: 0.1 10*3/uL (ref 0.0–0.7)
Eosinophils Relative: 2 % (ref 0–5)
HCT: 39.7 % (ref 39.0–52.0)
HEMOGLOBIN: 12.9 g/dL — AB (ref 13.0–17.0)
LYMPHS ABS: 1.8 10*3/uL (ref 0.7–4.0)
Lymphocytes Relative: 29 % (ref 12–46)
MCH: 26.9 pg (ref 26.0–34.0)
MCHC: 32.5 g/dL (ref 30.0–36.0)
MCV: 82.9 fL (ref 78.0–100.0)
MPV: 8.8 fL (ref 8.6–12.4)
Monocytes Absolute: 0.6 10*3/uL (ref 0.1–1.0)
Monocytes Relative: 10 % (ref 3–12)
NEUTROS PCT: 59 % (ref 43–77)
Neutro Abs: 3.7 10*3/uL (ref 1.7–7.7)
Platelets: 247 10*3/uL (ref 150–400)
RBC: 4.79 MIL/uL (ref 4.22–5.81)
RDW: 14.3 % (ref 11.5–15.5)
WBC: 6.2 10*3/uL (ref 4.0–10.5)

## 2015-11-05 LAB — LIPID PANEL
Cholesterol: 98 mg/dL — ABNORMAL LOW (ref 125–200)
HDL: 35 mg/dL — ABNORMAL LOW (ref >=40)
LDL Cholesterol: 48 mg/dL (ref <130)
Total CHOL/HDL Ratio: 2.8 Ratio (ref <=5.0)
Triglycerides: 73 mg/dL (ref <150)
VLDL: 15 mg/dL (ref <30)

## 2015-11-06 LAB — URINALYSIS
BILIRUBIN URINE: NEGATIVE
Glucose, UA: NEGATIVE
Hgb urine dipstick: NEGATIVE
Leukocytes, UA: NEGATIVE
Nitrite: NEGATIVE
SPECIFIC GRAVITY, URINE: 1.03 (ref 1.001–1.035)
pH: 6 (ref 5.0–8.0)

## 2015-11-06 LAB — URINE CYTOLOGY ANCILLARY ONLY
CHLAMYDIA, DNA PROBE: NEGATIVE
Neisseria Gonorrhea: NEGATIVE

## 2015-11-06 LAB — HEPATITIS B CORE ANTIBODY, TOTAL: HEP B C TOTAL AB: NONREACTIVE

## 2015-11-06 LAB — HEPATITIS B SURFACE ANTIGEN: Hepatitis B Surface Ag: NEGATIVE

## 2015-11-06 LAB — RPR

## 2015-11-06 LAB — HEPATITIS B SURFACE ANTIBODY,QUALITATIVE: Hep B S Ab: NEGATIVE

## 2015-11-06 LAB — HEPATITIS C ANTIBODY: HCV AB: NEGATIVE

## 2015-11-06 LAB — HEPATITIS A ANTIBODY, TOTAL: HEP A TOTAL AB: NONREACTIVE

## 2015-11-06 LAB — T-HELPER CELL (CD4) - (RCID CLINIC ONLY)
CD4 % Helper T Cell: 5 % — ABNORMAL LOW (ref 33–55)
CD4 T Cell Abs: 90 /uL — ABNORMAL LOW (ref 400–2700)

## 2015-11-06 NOTE — BH Specialist Note (Signed)
Counselor met with Francisco Mitchell today per request of nursing staff as patient was here as a new patient.  Counselor observed patient to be oriented times four with good affect and dress. Patient was alert and somewhat talkative.  Patient was soft spoken but confident and calm.  Counselor introduced to patient the information about counseling services. Counselor educated patient about his HIV status and shared what possible things he might experience as a newly diagnosed individual. Counselor communicated that the counseling door is always open if he ever has a need.  Patient stated that he would make an appointment or call if and when he has the need. Counselor provided support and encouragement accordingly.   Rolena Infante, MA [facility]

## 2015-11-08 LAB — QUANTIFERON TB GOLD ASSAY (BLOOD)
Interferon Gamma Release Assay: NEGATIVE
MITOGEN-NIL SO: 2.68 [IU]/mL
Quantiferon Nil Value: 0.09 IU/mL
Quantiferon Tb Ag Minus Nil Value: 0 IU/mL

## 2015-11-09 LAB — HIV-1 RNA ULTRAQUANT REFLEX TO GENTYP+
HIV 1 RNA Quant: 102407 copies/mL — ABNORMAL HIGH (ref <20)
HIV-1 RNA QUANT, LOG: 5.01 {Log_copies}/mL — AB (ref <1.30)

## 2015-11-11 ENCOUNTER — Telehealth: Payer: Self-pay

## 2015-11-11 ENCOUNTER — Other Ambulatory Visit: Payer: Self-pay | Admitting: *Deleted

## 2015-11-11 DIAGNOSIS — B20 Human immunodeficiency virus [HIV] disease: Secondary | ICD-10-CM | POA: Insufficient documentation

## 2015-11-11 MED ORDER — SULFAMETHOXAZOLE-TRIMETHOPRIM 800-160 MG PO TABS: 1.0000 | ORAL_TABLET | Freq: Two times a day (BID) | ORAL | Status: DC

## 2015-11-11 MED ORDER — ELVITEG-COBIC-EMTRICIT-TENOFAF 150-150-200-10 MG PO TABS: 1.0000 | ORAL_TABLET | Freq: Every day | ORAL | Status: DC

## 2015-11-11 NOTE — Progress Notes (Signed)
Patient is here after testing positive for HIV . Results were found through plasma donation. He is a single male who has sex with both males and females. Sex with males he is the insertive partner. In the last six months : sexually active with one male and one male with  no condom use.  Last year: 6 partners , 15 lifetime partners who were mostly male. His last unprotected intercourse was early summer of 2016 with a male partner with whom he engaged in unprotected intercourse several times.  Most of his sexual partners are know acquaintances,  he has never had sex with prostitutes and  never exchanged sex for money.  His last negative HIV  test was November 2015.  He has been experiencing night sweat since November, 2016 and has unintentional weight loss of 7 lbs in the last few month.    Francisco Mitchell has told his Mother and one friend about his diagnosis and feels he has good support.  He is currently a Holiday representative in [employer]. Patient was introduced to [place] and told she was available for counseling.   1 tattoo on left chest done in shop .  No medical records to request. Pneumonia and flu vaccine given.    Laurell Josephs, RN

## 2015-11-11 NOTE — Progress Notes (Signed)
Cd4 returned : 90  Viral load : 102,407.  Per Dr Drue Second patient will need to start Bactrim DS once daily and Genvoya.  He will need [facility] for Genvoya since his ADAP has not been approved. Message left on patient's voicemail to call office.  I will need the name of local pharmacy for the Bactrim script .

## 2015-11-11 NOTE — Telephone Encounter (Signed)
I spoke with Dr Drue Second regarding CD-4 of 90 and viral load of 102,407.  Patient was diagnosed 07-17-15. Per Dr Drue Second patient will need to start Bactrim DA 100 mg one daily and Genvoya once daily as soon as possible.  Patient is scheduled for return visit on 11-25-15.  I will inform patient of result.

## 2015-11-12 ENCOUNTER — Other Ambulatory Visit: Payer: Self-pay

## 2015-11-12 DIAGNOSIS — B20 Human immunodeficiency virus [HIV] disease: Secondary | ICD-10-CM

## 2015-11-12 LAB — HLA B*5701: HLA-B 5701 W/RFLX HLA-B HIGH: NEGATIVE

## 2015-11-12 MED ORDER — SULFAMETHOXAZOLE-TRIMETHOPRIM 800-160 MG PO TABS: 1.0000 | ORAL_TABLET | Freq: Once | ORAL | Status: DC

## 2015-11-12 NOTE — Telephone Encounter (Signed)
Patient was informed of labs results and the need to start Bactrim and Genvoya.   He will meet with financial counselor on Friday to apply for [employer].  Patient advised to make sure he keeps scheduled return appointment.   Laurell Josephs, RN

## 2015-11-17 LAB — HIV-1 GENOTYPR PLUS

## 2015-11-21 ENCOUNTER — Emergency Department (HOSPITAL_BASED_OUTPATIENT_CLINIC_OR_DEPARTMENT_OTHER): Payer: BLUE CROSS/BLUE SHIELD

## 2015-11-21 ENCOUNTER — Encounter (HOSPITAL_BASED_OUTPATIENT_CLINIC_OR_DEPARTMENT_OTHER): Payer: Self-pay | Admitting: Emergency Medicine

## 2015-11-21 ENCOUNTER — Emergency Department (HOSPITAL_BASED_OUTPATIENT_CLINIC_OR_DEPARTMENT_OTHER)
Admission: EM | Admit: 2015-11-21 | Discharge: 2015-11-22 | Disposition: A | Discharge status: Home / Self Care | Payer: BLUE CROSS/BLUE SHIELD | Attending: Emergency Medicine | Admitting: Emergency Medicine

## 2015-11-21 DIAGNOSIS — B20 Human immunodeficiency virus [HIV] disease: Secondary | ICD-10-CM | POA: Insufficient documentation

## 2015-11-21 DIAGNOSIS — Z79899 Other long term (current) drug therapy: Secondary | ICD-10-CM | POA: Diagnosis not present

## 2015-11-21 DIAGNOSIS — K604 Rectal fistula: Secondary | ICD-10-CM | POA: Diagnosis not present

## 2015-11-21 DIAGNOSIS — Z792 Long term (current) use of antibiotics: Secondary | ICD-10-CM | POA: Insufficient documentation

## 2015-11-21 DIAGNOSIS — R5383 Other fatigue: Secondary | ICD-10-CM | POA: Diagnosis present

## 2015-11-21 DIAGNOSIS — R195 Other fecal abnormalities: Secondary | ICD-10-CM | POA: Insufficient documentation

## 2015-11-21 HISTORY — DX: Human immunodeficiency virus (HIV) disease: B20

## 2015-11-21 LAB — CBC WITH DIFFERENTIAL/PLATELET
Basophils Absolute: 0 10*3/uL (ref 0.0–0.1)
Basophils Relative: 0 %
EOS PCT: 3 %
Eosinophils Absolute: 0.3 10*3/uL (ref 0.0–0.7)
HCT: 45 % (ref 39.0–52.0)
Hemoglobin: 14.7 g/dL (ref 13.0–17.0)
LYMPHS ABS: 2 10*3/uL (ref 0.7–4.0)
LYMPHS PCT: 20 %
MCH: 26.4 pg (ref 26.0–34.0)
MCHC: 32.7 g/dL (ref 30.0–36.0)
MCV: 80.8 fL (ref 78.0–100.0)
Monocytes Absolute: 0.7 10*3/uL (ref 0.1–1.0)
Monocytes Relative: 7 %
NEUTROS ABS: 6.9 10*3/uL (ref 1.7–7.7)
Neutrophils Relative %: 70 %
PLATELETS: 309 10*3/uL (ref 150–400)
RBC: 5.57 MIL/uL (ref 4.22–5.81)
RDW: 13.7 % (ref 11.5–15.5)
WBC: 9.9 10*3/uL (ref 4.0–10.5)

## 2015-11-21 LAB — RAPID STREP SCREEN (MED CTR MEBANE ONLY): Streptococcus, Group A Screen (Direct): NEGATIVE

## 2015-11-21 LAB — BASIC METABOLIC PANEL
Anion gap: 11 (ref 5–15)
BUN: 13 mg/dL (ref 6–20)
CO2: 24 mmol/L (ref 22–32)
Calcium: 8.9 mg/dL (ref 8.9–10.3)
Chloride: 95 mmol/L — ABNORMAL LOW (ref 101–111)
Creatinine, Ser: 1.23 mg/dL (ref 0.61–1.24)
GFR calc Af Amer: >60 mL/min (ref >60)
GFR calc non Af Amer: >60 mL/min (ref >60)
Glucose, Bld: 103 mg/dL — ABNORMAL HIGH (ref 65–99)
Potassium: 4.1 mmol/L (ref 3.5–5.1)
SODIUM: 130 mmol/L — AB (ref 135–145)

## 2015-11-21 MED ORDER — IOHEXOL 300 MG/ML  SOLN
100.0000 mL | Freq: Once | INTRAMUSCULAR | Status: AC | PRN
  Administered 2015-11-22: 100 mL via INTRAVENOUS

## 2015-11-21 MED ORDER — MORPHINE SULFATE (PF) 4 MG/ML IV SOLN
4.0000 mg | Freq: Once | INTRAVENOUS | Status: AC
Start: 2015-11-21 — End: 2015-11-22
  Administered 2015-11-22: 4 mg via INTRAVENOUS
  Filled 2015-11-21: qty 1

## 2015-11-21 MED ORDER — SODIUM CHLORIDE 0.9 % IV BOLUS (SEPSIS)
1000.0000 mL | Freq: Once | INTRAVENOUS | Status: AC
  Administered 2015-11-22: 1000 mL via INTRAVENOUS

## 2015-11-21 MED ORDER — ACETAMINOPHEN 325 MG PO TABS
650.0000 mg | ORAL_TABLET | Freq: Once | ORAL | Status: AC
  Administered 2015-11-21: 650 mg via ORAL
  Filled 2015-11-21: qty 2

## 2015-11-21 NOTE — ED Notes (Signed)
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  Pt also c/o pain from a cyst near his rectum.

## 2015-11-21 NOTE — ED Notes (Signed)
Pt also c/o sore throat for past 2 days.

## 2015-11-21 NOTE — ED Provider Notes (Signed)
CSN: [id number]     Arrival date & time 11/21/15  1952 History  By signing my name below, I, Terrance Branch, attest that this documentation has been prepared under the direction and in the presence of Shon Batonourtney F Haillie Radu, MD. Electronically Signed: Evon Slackerrance Branch, ED Scribe. 11/21/2015. 11:30 PM.    Chief Complaint  Patient presents with  . Fatigue   The history is provided by the patient. No language interpreter was used.   HPI Comments: Rosilyn MingsDreshaun Nappier is a 20 y.o. male who presents to the Emergency Department complaining of worsening Rectal pain onset several months. Pt states that it has recently worsened with in the last week. Reports blood in stool and pain when having a BM. Pt also presents with a fever and sore throat today. Pt report HX of HIV. Pt reports that he feels more fatigued than normal after playing tennis over the weekend. Pt reports that he is currently on antibiotics due to his CD4 count being below 80. Pt states that he is supposed to being his antiviral medications in 2 days. Denies nausea, vomiting, CP, cough or SOB.  Scheduled to see Dr. Drue SecondSnider on 3/15.     Past Medical History  Diagnosis Date  . Immune deficiency disorder (HCC)   . AIDS (acquired immunodeficiency syndrome), CD4 <=200 (HCC)   . AIDS (acquired immunodeficiency syndrome), CD4 <=200 [facility](HCC)    Past Surgical History  Procedure Laterality Date  . Hernia repair    . Fracture surgery Right     5th digit has pin 06/2014   Family History  Problem Relation Age of Onset  . Hypertension Father    Social History  Substance Use Topics  . Smoking status: Never Smoker   . Smokeless tobacco: None  . Alcohol Use: No    Review of Systems  Constitutional: Positive for fever.  Respiratory: Negative for cough and shortness of breath.   Cardiovascular: Negative for chest pain.  Gastrointestinal: Positive for blood in stool and rectal pain. Negative for nausea and vomiting.  All other systems reviewed and  are negative.     Allergies  Review of patient's allergies indicates no known allergies.  Home Medications   Prior to Admission medications   Medication Sig Start Date End Date Taking? Authorizing Provider  clindamycin (CLEOCIN) 150 MG capsule Take 2 capsules (300 mg total) by mouth every 6 (six) hours. 11/22/15   Shon Batonourtney F Gualberto Wahlen, MD  elvitegravir-cobicistat-emtricitabine-tenofovir (GENVOYA) 150-150-200-10 MG TABS tablet Take 1 tablet by mouth daily with breakfast. 11/11/15   Judyann Munsonynthia Snider, MD  fexofenadine (ALLEGRA) 180 MG tablet Take 180 mg by mouth daily. Reported on 11/05/2015    Historical Provider, MD  lidocaine (XYLOCAINE) 5 % ointment Apply 1 application topically 4 (four) times daily as needed. 11/22/15   Shon Batonourtney F Yuvin Bussiere, MD  naproxen (NAPROSYN) 500 MG tablet Take 1 tablet (500 mg total) by mouth 2 (two) times daily. Patient not taking: Reported on 11/05/2015 11/30/13   Janne NapoleonHope M Neese, NP  oxyCODONE-acetaminophen (PERCOCET/ROXICET) 5-325 MG tablet Take 1 tablet by mouth every 6 (six) hours as needed for severe pain. 11/22/15   Shon Batonourtney F Bobbe Quilter, MD  senna-docusate (SENOKOT-S) 8.6-50 MG tablet Take 1 tablet by mouth 2 (two) times daily. 11/22/15   Shon Batonourtney F Joleah Kosak, MD  sulfamethoxazole-trimethoprim (BACTRIM DS,SEPTRA DS) 800-160 MG tablet Take 1 tablet by mouth once. Patient taking differently: Take 1 tablet by mouth daily.  11/12/15   Judyann Munsonynthia Snider, MD   BP 116/69 mmHg  Pulse 74  Temp(Src)  99 F (37.2 C) (Oral)  Resp 18  Ht  (1.88 m)  Wt 165 lb 6.4 oz (75.025 kg)  BMI 21.23 kg/m2  SpO2 99%   Physical Exam  Constitutional: He is oriented to person, place, and time. No distress.  Tall, thin  HENT:  Head: Normocephalic and atraumatic.  Cardiovascular: Normal rate, regular rhythm and normal heart sounds.   No murmur heard. Pulmonary/Chest: Effort normal and breath sounds normal. No respiratory distress. He has no wheezes.  Abdominal: Soft. Bowel sounds are normal. There  is no tenderness. There is no rebound and no guarding.  Genitourinary: Rectal exam shows tenderness.     Exquisite tenderness to palpation with digital rectal exam, fullness noted within the rectum corresponding to induration noted externally  Musculoskeletal: He exhibits no edema.  Neurological: He is alert and oriented to person, place, and time.  Skin: Skin is warm and dry.  Psychiatric: He has a normal mood and affect.  Nursing note and vitals reviewed.   ED Course  Procedures (including critical care time) DIAGNOSTIC STUDIES: Oxygen Saturation is 100% on RA, normal by my interpretation.    COORDINATION OF CARE: 11:22 PM-Discussed treatment plan with pt at bedside and pt agreed to plan.     Labs Review Labs Reviewed  BASIC METABOLIC PANEL - Abnormal; Notable for the following:    Sodium 130 (*)    Chloride 95 (*)    Glucose, Bld 103 (*)    All other components within normal limits  RAPID STREP SCREEN (NOT AT [facility])  CULTURE, GROUP A STREP ([facility])  CULTURE, BLOOD (ROUTINE X 2)  CULTURE, BLOOD (ROUTINE X 2)  CBC WITH DIFFERENTIAL/PLATELET  I-STAT CG4 LACTIC ACID, ED    Imaging Review Dg Chest 2 View  11/22/2015  CLINICAL DATA:  Fever EXAM: CHEST  2 VIEW COMPARISON:  None. FINDINGS: Normal heart size and mediastinal contours. No acute infiltrate or edema. No effusion or pneumothorax. No acute osseous findings. IMPRESSION: Negative. Electronically Signed   By: Marnee Spring M.D.   On: 11/22/2015 00:45   Ct Pelvis W Contrast  11/22/2015  CLINICAL DATA:  Evaluate for rectal abscess EXAM: CT PELVIS WITH CONTRAST TECHNIQUE: Multidetector CT imaging of the pelvis was performed using the standard protocol following the bolus administration of intravenous contrast. CONTRAST:  OMNIPAQUE IOHEXOL 300 MG/ML  SOLN COMPARISON:  None. FINDINGS: There is a flat fluid and gas containing collection in the left perineum measuring 38 x 4 x 28 mm. This is contiguous with the 3 o'clock  anorectal junction. The sphincters are not well resolved for further characterization; outpatient MRI could further evaluate. No abscess seen above the levator. In the pelvis, no sign of enteritis or colitis. No sacroiliitis. Negative bladder and visible reproductive organs. IMPRESSION: 38 x 4 x 28 mm fistula and/or collapsed abscess extending from the left anorectal junction. Electronically Signed   By: Marnee Spring M.D.   On: 11/22/2015 00:44   I have personally reviewed and evaluated these images and lab results as part of my medical decision-making.   EKG Interpretation None      MDM   Final diagnoses:  Rectal fistula  HIV (human immunodeficiency virus infection) (HCC)    Patient presents with rectal pain. Recent diagnosis of HIV with a low CD4 count. Has not started antiretroviral therapy. Due to follow up with infectious disease later today. He is on daily Bactrim. Temperature 101.7 and tachycardia noted on initial exam. Otherwise he is nontoxic-appearing. He does have  evidence of fistula versus abscess just left of the anus and is exquisitely tender on digital rectal exam. Lab work is largely reassuring. He has mild hyponatremia and reports generalized fatigue. Patient was given fluids. Lactate is normal. Chest x-ray shows no evidence of pneumonia. CT scan of the pelvis shows likely fistula extending to the anorectal junction.  4:00 AM Discussed with Dr. Carolynne Edouard on call for surgery. Recommends antibiotics and close surgical follow-up with Dr. Maisie Fus.  Patient given IV vancomycin. Lactate is normal. While patient is febrile, there is no sign of sepsis at this time.  Discussed with patient the importance of follow-up. Will discharge home on clindamycin. Sitz baths and stool softeners. Follow-up with infectious disease as previously scheduled.  After history, exam, and medical workup I feel the patient has been appropriately medically screened and is safe for discharge home. Pertinent  diagnoses were discussed with the patient. Patient was given return precautions.  I personally performed the services described in this documentation, which was scribed in my presence. The recorded information has been reviewed and is accurate.      Shon Baton, MD 11/22/15 [phone]

## 2015-11-22 LAB — I-STAT CG4 LACTIC ACID, ED: LACTIC ACID, VENOUS: 0.65 mmol/L (ref 0.5–2.0)

## 2015-11-22 MED ORDER — SENNOSIDES-DOCUSATE SODIUM 8.6-50 MG PO TABS: 1.0000 | ORAL_TABLET | Freq: Two times a day (BID) | ORAL | Status: DC

## 2015-11-22 MED ORDER — LIDOCAINE 5 % EX OINT: 1.0000 "application " | TOPICAL_OINTMENT | Freq: Four times a day (QID) | CUTANEOUS | Status: DC | PRN

## 2015-11-22 MED ORDER — OXYCODONE-ACETAMINOPHEN 5-325 MG PO TABS: 1.0000 | ORAL_TABLET | Freq: Four times a day (QID) | ORAL | Status: DC | PRN

## 2015-11-22 MED ORDER — CLINDAMYCIN HCL 150 MG PO CAPS: 300.0000 mg | ORAL_CAPSULE | Freq: Four times a day (QID) | ORAL | Status: DC

## 2015-11-22 MED ORDER — VANCOMYCIN HCL IN DEXTROSE 1-5 GM/200ML-% IV SOLN
1000.0000 mg | Freq: Once | INTRAVENOUS | Status: AC
  Administered 2015-11-22: 1000 mg via INTRAVENOUS
  Filled 2015-11-22: qty 200

## 2015-11-22 NOTE — Discharge Instructions (Signed)
Anal Fistula An anal fistula is an abnormal tunnel that develops between the bowel and the skin near the outside of the anus, where stool (feces) comes out. The anus has many tiny glands that make lubricating fluid. Sometimes, these glands become plugged and infected, and that can cause a fluid-filled pocket (abscess) to form. An anal fistula often develops after this infection or abscess. CAUSES In most cases, an anal fistula is caused by a past or current anal abscess. Other causes include:  A complication of surgery.  Trauma to the rectal area.  Radiation to the area.  Medical conditions or diseases, such as:  Chronic inflammatory bowel disease, such as Crohn disease or ulcerative colitis.  Colon cancer or rectal cancer.  Diverticular disease, such as diverticulitis.  An STD (sexually transmitted disease), such as gonorrhea, chlamydia, or syphilis.  An infection that is caused by HIV (human immunodeficiency virus).  Foreign body in the rectum. SYMPTOMS Symptoms of this condition include:  Throbbing or constant pain that may be worse while you are sitting.  Swelling or irritation around the anus.  Drainage of pus or blood from an opening near the anus.  Pain with bowel movements.  Fever or chills. DIAGNOSIS Your health care provider will examine the area to find the openings of the anal fistula and the fistula tract. The external opening of the anal fistula may be seen during a physical exam. You may also have tests, including:  An exam of the rectal area with a gloved hand (digital rectal exam).  An exam with a probe or scope to help locate the internal opening of the fistula.  Imaging tests to find the exact location and path of the fistula. These tests may include X-rays, an ultrasound, a CT scan, or MRI. The path is made visible by a dye that is injected into the fistula opening. You may have other tests to find the cause of the anal fistula. TREATMENT The most  common treatment for an anal fistula is surgery. The type of surgery that is used will depend on where the fistula is located and how complex the fistula is. Surgical options include:  A fistulotomy. The whole fistula is opened up, and the contents are drained to promote healing.  Seton placement. A silk string (seton) is placed into the fistula during a fistulotomy. This helps to drain any infection to promote healing.  Advancement flap procedure. Tissue is removed from your rectum or the skin around the anus and is attached to the opening of the fistula.  Bioprosthetic plug. A cone-shaped plug is made from your tissue and is used to block the opening of the fistula. Some anal fistulas do not require surgery. A nonsurgical treatment option involves injecting a fibrin glue to seal the fistula. You also may be prescribed an antibiotic medicine to treat an infection. HOME CARE INSTRUCTIONS Medicines  Take over-the-counter and prescription medicines only as told by your health care provider.  If you were prescribed an antibiotic medicine, take it as told by your health care provider. Do not stop taking the antibiotic even if you start to feel better.  Use a stool softener or a laxative if told to do so by your health care provider. General Instructions  Eat a high-fiber diet as told by your health care provider. This can help to prevent constipation.  Drink enough fluid to keep your urine clear or pale yellow.  Take a warm sitz bath for 15-20 minutes, 3-4 times per day, or as  told by your health care provider. Sitz baths can ease your pain and discomfort and help with healing.  Follow good hygiene to keep the anal area as clean and dry as possible. Use wet toilet paper or a moist towelette after each bowel movement.  Keep all follow-up visits as told by your health care provider. This is important. SEEK MEDICAL CARE IF:  You have increased pain that is not controlled with  medicines.  You have new redness or swelling around the anal area.  You have new fluid, blood, or pus coming from the anal area.  You have tenderness or warmth around the anal area. SEEK IMMEDIATE MEDICAL CARE IF:  You have a fever.  You have severe pain.  You have chills or diarrhea.  You have severe problems urinating or having a bowel movement.   This information is not intended to replace advice given to you by your health care provider. Make sure you discuss any questions you have with your health care provider.   Document Released: 08/11/2008 Document Revised: 05/20/2015 Document Reviewed: 11/24/2014 Elsevier Interactive Patient Education 2016 Elsevier Inc.  HIV Infection and AIDS HIV (human immunodeficiency virus) infection is a permanent (chronic) viral infection. HIV kills white blood cells that are called CD4 cells. These cells help to control your body's defense system (immune system) and fight infection. If you do not have enough CD4 cells, you can develop infections, cancers, and other health problems. If it is not treated, HIV infection advances through three phases:  Asymptomatic phase.  Early symptomatic phase.  Symptomatic phase (also known as AIDS, or acquired immunodeficiency syndrome). CAUSES HIV infection is caused by the human immunodeficiency virus. This virus is passed from one person to another person through sex, through contact with infected blood, or during childbirth or breastfeeding. RISK FACTORS  Having unprotected sex.  Sharing needles or other drug equipment. SYMPTOMS Asymptomatic Phase You may not feel sick, or you may only feel sick some of the time. Many people do not know that they have HIV in this phase. Symptoms may include:  Low-grade fever.  Rash.  Fatigue.  Sore throat.  Headaches.  Nausea, vomiting, or diarrhea.  Night sweats. Early Symptomatic Phase You may notice:  Your early symptoms getting worse or happening  more often.  Oral, vaginal, or rectal sores that are caused by infections.  Problems that are related to inflammation, such as joint pain. Symptomatic Phase (AIDS) Your immune system no longer protects you from infections and other health problems. You may get opportunistic diseases, which are infections or conditions that you would not normally get if your immune system was healthy and working properly. Problems that are caused by opportunistic diseases include:  Coughing.  Trouble breathing.  Diarrhea.  Skin sores.  Trouble swallowing.  High fevers.  Blurred vision.  Stiff neck.  Mental confusion. You may also begin to notice:  Weight loss.  Tingling or pain in your hands and feet.  Mouth sores or tooth pain.  Severe fatigue. DIAGNOSIS Your health care provider will do a screening test that looks for a chemical in your body that is produced only when it is trying to fight HIV. HIV is confirmed with another blood test. TREATMENT There is no cure for HIV infection, but there are treatments that can keep HIV from getting worse. You will be given medicines called antiretroviral therapy (ART) based on your lab tests, your medical history, past treatments for HIV, and your other health problems. ART will:  Keep  your immune system as healthy as possible and help it work better.  Decrease the amount of HIV in your body.  Reduce the risk of problems caused by HIV.  Prolong your life.  Improve the quality of your life.  Help prevent passing HIV to someone else. You will need to take ART for the rest of your life. You will need to have routine lab tests performed to monitor your treatment and immune system. HOME CARE INSTRUCTIONS  See your health care provider and have your blood tested every 3-6 months to monitor your health and to make sure your treatment is working.  Take your medicines every day as directed by your health care provider.  Stop or decrease your use  of alcohol, tobacco, and recreational drugs, which can cause further damage to your immune system. They can also cause problems with your liver, lungs, and heart.  Protect yourself from other sexual infections by using condoms when you have sex.  Protect yourself from other blood infections by using your own equipment if you inject, smoke, or snort drugs. Do not share equipment.  Tell your sexual partner(s) that you have HIV. Encourage them to get tested.  Keep your vaccinations up to date. Make sure that you get all recommended vaccines, including vaccines for hepatitis A, hepatitis B, measles, and influenza.  Eat in a healthy way, exercise, and get enough sleep.  See your dentist regularly. Brush and floss you teeth every day.  See a counselor or a Child psychotherapist to help you solve problems and find any services that you need.  Get support from your family and friends. PREVENTION To prevent the spread of HIV:  Talk with your health care provider about protecting your sexual partner(s) from HIV. Your health care provider may encourage your partner(s) to take HIV medicines to decrease the risk of getting HIV. These medicines are called pre-exposure prophylaxis (PrEP).  Use a condom every time you have sexual intercourse. This includes vaginal, oral, and anal sexual activity.  The condom should be in place from the beginning to the end of sexual activity.  Use only latex or polyurethane condoms and water-based lubricants.  Wearing a condom reduces, but does not completely eliminate, your risk of spreading HIV.  Condoms also protect you from other STDs (sexually transmitted diseases).  Avoid alcohol and recreational drugs that affect your judgment. They may make you forget to use a condom or increase your chances of participating in high-risk sex.  Do not share equipment that you use to take drugs, such as needles, syringes, cookers, tourniquets, pipes, or straws. If you share  equipment, clean it before and after you use it. SEEK MEDICAL CARE IF:  You lose a lot of weight.  You have extreme fatigue.  You have trouble swallowing.  You have vomiting or diarrhea that does not get better.  You have muscle pain or joint pain.  You have any problems that are related to your medicines. SEEK IMMEDIATE MEDICAL CARE IF:  You have a rash that causes your skin to peel.  You develop blisters inside your mouth.  You have pain in your abdomen.  You have swelling around your eyes or you have eye redness.  You have a high fever and chills.  You have shortness of breath or a cough that is dry (nonproductive) or wet (productive).  You have vision problems, such as blind spots, flashing lights, or decreased or blurred vision.  You have a persistent headache, confusion, or changes in  the way that you think or see things (altered mental status).   This information is not intended to replace advice given to you by your health care provider. Make sure you discuss any questions you have with your health care provider.   Document Released: 06/13/2014 Document Revised: 01/13/2015 Document Reviewed: 06/13/2014 Elsevier Interactive Patient Education [employer]2016 Elsevier Inc.

## 2015-11-23 ENCOUNTER — Other Ambulatory Visit: Payer: Self-pay

## 2015-11-23 ENCOUNTER — Ambulatory Visit (INDEPENDENT_AMBULATORY_CARE_PROVIDER_SITE_OTHER): Payer: BLUE CROSS/BLUE SHIELD | Admitting: Infectious Diseases

## 2015-11-23 ENCOUNTER — Encounter: Payer: Self-pay | Admitting: Infectious Diseases

## 2015-11-23 VITALS — BP 105/63 | HR 92

## 2015-11-23 DIAGNOSIS — B3781 Candidal esophagitis: Secondary | ICD-10-CM | POA: Diagnosis not present

## 2015-11-23 DIAGNOSIS — B20 Human immunodeficiency virus [HIV] disease: Secondary | ICD-10-CM

## 2015-11-23 DIAGNOSIS — B370 Candidal stomatitis: Secondary | ICD-10-CM | POA: Insufficient documentation

## 2015-11-23 DIAGNOSIS — T887XXA Unspecified adverse effect of drug or medicament, initial encounter: Secondary | ICD-10-CM

## 2015-11-23 DIAGNOSIS — K603 Anal fistula: Secondary | ICD-10-CM | POA: Insufficient documentation

## 2015-11-23 DIAGNOSIS — T50905A Adverse effect of unspecified drugs, medicaments and biological substances, initial encounter: Secondary | ICD-10-CM | POA: Insufficient documentation

## 2015-11-23 MED ORDER — ELVITEG-COBIC-EMTRICIT-TENOFAF 150-150-200-10 MG PO TABS: 1.0000 | ORAL_TABLET | Freq: Every day | ORAL | Status: DC

## 2015-11-23 MED ORDER — FLUCONAZOLE 100 MG PO TABS
100.0000 mg | ORAL_TABLET | Freq: Every day | ORAL | Status: DC
Start: 2015-11-23 — End: 2015-12-23

## 2015-11-23 MED ORDER — METHYLPREDNISOLONE SODIUM SUCC 125 MG IJ SOLR
125.0000 mg | Freq: Once | INTRAMUSCULAR | Status: AC
  Administered 2015-11-23: 125 mg via INTRAVENOUS

## 2015-11-23 MED ORDER — METHYLPREDNISOLONE SODIUM SUCC 125 MG IJ SOLR: 125.0000 mg | Freq: Once | INTRAMUSCULAR | Status: DC

## 2015-11-23 NOTE — Assessment & Plan Note (Signed)
Will start him on diflucan for 10 days.

## 2015-11-23 NOTE — Telephone Encounter (Signed)
Patients mother is on the phone stating Liz MaladyDreshaun has insurance.  He never told us this because he thought there was a free program.   I will call medication to pharmacy and he will need to pick up co pay card.   Laurell Josephsammy K King, RN

## 2015-11-23 NOTE — Assessment & Plan Note (Signed)
Will give him 2 weeks of augmentin Will try to get him into surgery ASAP

## 2015-11-23 NOTE — Assessment & Plan Note (Signed)
Will start him on genvoya Offered/refused condoms.  Needs HPV vax Needs Hep A and B Vax Will see back in 2 weeks.  Hold on bactrim currently due to concerns about ADR. Some concern about starting him on multiple meds in face of current ADR.  rtc in 2 weeks.

## 2015-11-23 NOTE — Progress Notes (Signed)
   Subjective:    Patient ID: Francisco Mitchell, male    DOB: 11/25/1995, 20 y.o.   MRN: [id number]  HPI 20 yo M with newly dx HIV+ on 07-16-15. No ART.   4 days ago he was dehydrated and started to develop fever. He called ID office and went to ED 3-11.  He was started on septra since 3-10.  Since leaving ED he has had facial swelling, ears, back. Also erythematous rash. Has taken 25mg  of benadryl x1 and has had some improvement.  No dysphagia, no sob or cough.  Has had early satiety, lost 15# in last month.   Has rectal fistula for "awhile". No hx of surgery.  He had CT scan which showed 38 x 4 x 28 mm fistula and/or collapsed abscess extending from the left anorectal junction..  This was not drained in ED.   The past medical history, family history and social history were reviewed/updated in EPIC   Review of Systems  Constitutional: Positive for fever, appetite change and unexpected weight change.  HENT: Negative for mouth sores.   Respiratory: Negative for cough and shortness of breath.   Cardiovascular: Negative for chest pain.  Gastrointestinal: Positive for constipation. Negative for diarrhea.  Genitourinary: Negative for difficulty urinating.       Objective:   Physical Exam  Constitutional: He appears well-developed and well-nourished.  HENT:  Mouth/Throat: Oropharyngeal exudate present.  Eyes: EOM are normal. Pupils are equal, round, and reactive to light.  Neck: Neck supple.  Cardiovascular: Normal rate, regular rhythm and normal heart sounds.   Pulmonary/Chest: Effort normal and breath sounds normal.  Abdominal: Soft. Bowel sounds are normal. There is no tenderness. There is no rebound.  Genitourinary:     Musculoskeletal: He exhibits no edema.  Lymphadenopathy:    He has no cervical adenopathy.       Assessment & Plan:

## 2015-11-23 NOTE — Addendum Note (Signed)
Addended by: Laurell JosephsKING, TAMMY K on: 11/23/2015 12:29 PM   Modules accepted: Orders, Medications

## 2015-11-23 NOTE — Assessment & Plan Note (Signed)
Will give him solumedrol 125mg  IM now.  Suspect contrast reaction

## 2015-11-24 LAB — CULTURE, GROUP A STREP (THRC)

## 2015-11-25 ENCOUNTER — Ambulatory Visit: Payer: Self-pay | Admitting: Internal Medicine

## 2015-11-27 LAB — CULTURE, BLOOD (ROUTINE X 2)
Culture: NO GROWTH
Culture: NO GROWTH

## 2015-12-07 ENCOUNTER — Encounter: Payer: Self-pay | Admitting: Infectious Diseases

## 2015-12-07 ENCOUNTER — Ambulatory Visit (INDEPENDENT_AMBULATORY_CARE_PROVIDER_SITE_OTHER): Payer: BLUE CROSS/BLUE SHIELD | Admitting: Infectious Diseases

## 2015-12-07 ENCOUNTER — Other Ambulatory Visit: Payer: Self-pay | Admitting: General Surgery

## 2015-12-07 VITALS — BP 101/67 | HR 76 | Temp 97.9°F | Ht 74.0 in | Wt 173.0 lb

## 2015-12-07 DIAGNOSIS — B20 Human immunodeficiency virus [HIV] disease: Secondary | ICD-10-CM | POA: Diagnosis not present

## 2015-12-07 DIAGNOSIS — Z23 Encounter for immunization: Secondary | ICD-10-CM

## 2015-12-07 DIAGNOSIS — B3781 Candidal esophagitis: Secondary | ICD-10-CM | POA: Diagnosis not present

## 2015-12-07 DIAGNOSIS — K603 Anal fistula: Secondary | ICD-10-CM

## 2015-12-07 DIAGNOSIS — B370 Candidal stomatitis: Secondary | ICD-10-CM

## 2015-12-07 NOTE — Assessment & Plan Note (Signed)
Appears to be doing well. Will continue his genvoya, bactrim at this point.  Will get Hep A/B/HPV today Will see him back in 6 weeks. Hopefully stop bactrim then.

## 2015-12-07 NOTE — Assessment & Plan Note (Signed)
Will write him letter of clearance. 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

## 2015-12-07 NOTE — Progress Notes (Signed)
   Subjective:    Patient ID: Francisco Mitchell, male    DOB: 04/05/1996, 20 y.o.   MRN: [id number]  HPI  20 yo M with newly dx HIV+ on 07-16-15. Seen March 2017 and started on genvoya, bactrim.   He has been seen at counseling center [facility] campus for depression.  Was seen by surgery and needs "clearance" for repair of his fistula. Occas drainage from fistula.  No problems with meds.  Has not been playing tennis lately as he is concerned how his body will react.   HIV 1 RNA QUANT (copies/mL)  Date Value  11/05/2015 102407*   CD4 T CELL ABS (/uL)  Date Value  11/05/2015 90*     Review of Systems  Constitutional: Negative for appetite change.  HENT: Negative for mouth sores.   Gastrointestinal: Negative for diarrhea and constipation.  Genitourinary: Negative for difficulty urinating.  no dysphagia.      Objective:   Physical Exam  Constitutional: He appears well-developed and well-nourished.  HENT:  Mouth/Throat: No oropharyngeal exudate.  Eyes: EOM are normal. Pupils are equal, round, and reactive to light.  Neck: Neck supple.  Cardiovascular: Normal rate, regular rhythm and normal heart sounds.   Pulmonary/Chest: Effort normal and breath sounds normal.  Abdominal: Soft. Bowel sounds are normal. There is no tenderness. There is no rebound.  Musculoskeletal: He exhibits no edema.  Lymphadenopathy:    He has no cervical adenopathy.      Assessment & Plan:

## 2015-12-07 NOTE — H&P (Signed)
History of Present Illness Francisco Levee MD; 12/01/2015 2:50 PM) Patient words: fistula.  The patient is a 20 year old male who presents with anal pain. 20yo M who presents to the office with a chronically draining anal mass. This drains every few days and is very tender to palpation. He has never had this before. It seemed to all start in Jan with a larger abcess.   Other Problems Francisco Mitchell, [place]; 12/01/2015 2:36 PM) Depression Hemorrhoids HIV-positive  Diagnostic Studies History Francisco Mitchell, [place]; 12/01/2015 2:36 PM) Colonoscopy never  Allergies Francisco Mitchell, [place]; 12/01/2015 2:37 PM) Iodinated Contrast Media  Medication History Francisco Mitchell, [place]; 12/01/2015 2:37 PM) Oxycodone-Acetaminophen (5-325MG  Tablet, Oral) Active. Clindamycin HCl (  Capsule, Oral) Active. [facility] Senna Plus (8.6-50MG  Tablet, Oral) Active. Genvoya (150-150-200-10MG  Tablet, Oral) Active. Lidocaine (5% Ointment, External) Active. Sulfamethoxazole-Trimethoprim (800-160MG  Tablet, Oral) Active. Medications Reconciled  Social History Francisco Mitchell, [place]; 12/01/2015 2:36 PM) Alcohol use Occasional alcohol use. Caffeine use Carbonated beverages, Coffee, Tea. Illicit drug use Uses socially only. Tobacco use Never smoker.  Family History Francisco Mitchell, [place]; 12/01/2015 2:36 PM) First Degree Relatives No pertinent family history    Review of Systems Francisco Mitchell R. Brooks CMA; 12/01/2015 2:37 PM) General Present- Appetite Loss, Chills, Fatigue, Fever, Night Sweats and Weight Loss. Not Present- Weight Gain. Skin Present- Rash. Not Present- Change in Wart/Mole, Dryness, Hives, Jaundice, New Lesions, Non-Healing Wounds and Ulcer. HEENT Present- Seasonal Allergies. Not Present- Earache, Hearing Loss, Hoarseness, Nose Bleed, Oral Ulcers, Ringing in the Ears, Sinus Pain, Sore Throat, Visual Disturbances, Wears glasses/contact lenses and Yellow Eyes. Respiratory Not  Present- Bloody sputum, Chronic Cough, Difficulty Breathing, Snoring and Wheezing. Cardiovascular Not Present- Chest Pain, Difficulty Breathing Lying Down, Leg Cramps, Palpitations, Rapid Heart Rate, Shortness of Breath and Swelling of Extremities. Gastrointestinal Present- Bloody Stool, Change in Bowel Habits, Chronic diarrhea, Constipation, Excessive gas, Gets full quickly at meals, Hemorrhoids and Rectal Pain. Not Present- Abdominal Pain, Bloating, Difficulty Swallowing, Indigestion, Nausea and Vomiting. Male Genitourinary Present- Change in Urinary Stream. Not Present- Blood in Urine, Frequency, Impotence, Nocturia, Painful Urination, Urgency and Urine Leakage. Musculoskeletal Present- Back Pain, Joint Stiffness and Muscle Weakness. Not Present- Joint Pain, Muscle Pain and Swelling of Extremities. Neurological Not Present- Decreased Memory, Fainting, Headaches, Numbness, Seizures, Tingling, Tremor, Trouble walking and Weakness. Psychiatric Present- Depression. Not Present- Anxiety, Bipolar, Change in Sleep Pattern, Fearful and Frequent crying. Endocrine Present- Hot flashes. Not Present- Cold Intolerance, Excessive Hunger, Hair Changes, Heat Intolerance and New Diabetes. Hematology Present- HIV. Not Present- Easy Bruising, Excessive bleeding, Gland problems and Persistent Infections.  Vitals [employer] R. Brooks CMA; 12/01/2015 2:36 PM) 12/01/2015 2:36 PM Weight: 166.25 lb Height: 74in Body Surface Area: 2.01 m Body Mass Index: 21.35 kg/m  Temp.: 98.53F(Oral)  Pulse: 78 (Regular)  BP: 100/62 (Sitting, Left Arm, Standard)       Physical Exam Francisco Levee MD; 12/01/2015 2:52 PM) General Mental Status-Alert. General Appearance-Not in acute distress. Build & Nutrition-Well nourished. Posture-Normal posture. Gait-Normal.  Head and Neck Head-normocephalic, atraumatic with no lesions or palpable masses. Trachea-midline.  Chest and Lung Exam Chest and lung  exam reveals -on auscultation, normal breath sounds, no adventitious sounds and normal vocal resonance.  Cardiovascular Cardiovascular examination reveals -normal heart sounds, regular rate and rhythm with no murmurs.  Abdomen Inspection Inspection of the abdomen reveals - No Hernias. Palpation/Percussion Palpation and Percussion of the abdomen reveal - Soft, Non Tender, No Rigidity (guarding), No hepatosplenomegaly and No Palpable  abdominal masses.  Rectal Anorectal Exam External - Note: mass in L anterior perianal region with slight fluctuance and minimal tenderness to palpation.  Neurologic Neurologic evaluation reveals -alert and oriented x 3 with no impairment of recent or remote memory, normal attention span and ability to concentrate, normal sensation and normal coordination.  Musculoskeletal Normal Exam - Bilateral-Upper Extremity Strength Normal and Lower Extremity Strength Normal.    Assessment & Plan Francisco Mitchell(Mariel Lukins MD; 12/01/2015 2:51 PM) ANAL FISTULA (K60.3) Impression: 20yo M with newly diagnosed HIV who presents to the office with a presumed anal fistula. I have recommended an EUA once his CD4 counts recover. This could be a fistula or chronic abscess that has not healed due to his immunosuppresion.

## 2015-12-07 NOTE — Assessment & Plan Note (Signed)
Resolved

## 2015-12-19 ENCOUNTER — Ambulatory Visit (INDEPENDENT_AMBULATORY_CARE_PROVIDER_SITE_OTHER): Payer: BLUE CROSS/BLUE SHIELD

## 2015-12-19 ENCOUNTER — Ambulatory Visit (HOSPITAL_COMMUNITY)
Admission: EM | Admit: 2015-12-19 | Discharge: 2015-12-19 | Disposition: A | Discharge status: Home / Self Care | Payer: BLUE CROSS/BLUE SHIELD | Attending: Family Medicine | Admitting: Family Medicine

## 2015-12-19 ENCOUNTER — Encounter (HOSPITAL_COMMUNITY): Payer: Self-pay

## 2015-12-19 DIAGNOSIS — S62306A Unspecified fracture of fifth metacarpal bone, right hand, initial encounter for closed fracture: Secondary | ICD-10-CM

## 2015-12-19 DIAGNOSIS — S6991XA Unspecified injury of right wrist, hand and finger(s), initial encounter: Secondary | ICD-10-CM | POA: Diagnosis not present

## 2015-12-19 MED ORDER — OXYCODONE-ACETAMINOPHEN 5-325 MG PO TABS: 1.0000 | ORAL_TABLET | Freq: Four times a day (QID) | ORAL | Status: DC | PRN

## 2015-12-19 NOTE — ED Provider Notes (Addendum)
CSN: [id number]     Arrival date & time 12/19/15  1803 History   First MD Initiated Contact with Patient 12/19/15 1946     Chief Complaint  Patient presents with  . Hand Injury   (Consider location/radiation/quality/duration/timing/severity/associated sxs/prior Treatment) Patient is a 20 y.o. male presenting with hand injury. The history is provided by the patient. No language interpreter was used.  Hand Injury Associated symptoms: no fatigue and no fever   Patient presents with complaint of R hand pain after striking against a metal pole while playing tennis.  He has a history of a displaced R fifth metacarpal fracture s/p ORIF by Dr Amanda PeaGramig in [place]/Nov 2015.    He reports that after today's injury, which occurred at about 6pm, he did not think he had injured it severely. However, the ulnar side of the R hand became very swollen and tender thereafter.  [facility]   Past Medical History  Diagnosis Date  . AIDS (acquired immunodeficiency syndrome), CD4 <=200 [facility](HCC)    Past Surgical History  Procedure Laterality Date  . Hernia repair    . Fracture surgery Right     5th digit has pin 06/2014   Family History  Problem Relation Age of Onset  . Hypertension Father   . Hypertension Mother    Social History  Substance Use Topics  . Smoking status: Never Smoker   . Smokeless tobacco: None  . Alcohol Use: No    Review of Systems  Constitutional: Negative for fever, diaphoresis and fatigue.    Allergies  Contrast media  Home Medications   Prior to Admission medications   Medication Sig Start Date End Date Taking? Authorizing Provider  elvitegravir-cobicistat-emtricitabine-tenofovir (GENVOYA) 150-150-200-10 MG TABS tablet Take 1 tablet by mouth daily with breakfast. 11/23/15  Yes Ginnie SmartJeffrey C Hatcher, MD  fexofenadine (ALLEGRA) 180 MG tablet Take 180 mg by mouth daily. Reported on 11/05/2015   Yes Historical Provider, MD  fluconazole (DIFLUCAN) 100 MG tablet  Take 1 tablet (100 mg total) by mouth daily. 11/23/15  Yes Ginnie SmartJeffrey C Hatcher, MD  lidocaine (XYLOCAINE) 5 % ointment Apply 1 application topically 4 (four) times daily as needed. 11/22/15   Shon Batonourtney F Horton, MD  naproxen (NAPROSYN) 500 MG tablet Take 1 tablet (500 mg total) by mouth 2 (two) times daily. Patient not taking: Reported on 11/05/2015 11/30/13   Janne NapoleonHope M Neese, NP  oxyCODONE-acetaminophen (PERCOCET/ROXICET) 5-325 MG tablet Take 1 tablet by mouth every 6 (six) hours as needed for severe pain. 11/22/15   Shon Batonourtney F Horton, MD  senna-docusate (SENOKOT-S) 8.6-50 MG tablet Take 1 tablet by mouth 2 (two) times daily. 11/22/15   Shon Batonourtney F Horton, MD   Meds Ordered and Administered this Visit  Medications - No data to display  BP 119/75 mmHg  Pulse 72  Temp(Src) 98.8 F (37.1 C) (Oral) No data found.   Physical Exam  Constitutional: He appears well-developed and well-nourished. No distress.  Neck: Neck supple.  Musculoskeletal:  Right hand tenderness and swelling over the fifth metacarpal.  Healed surgical scar distal to area of edema.  Palpable radial pulse on R; sensation of fingertips of all R fingers intact.  Able to make fist with R hand.   Full R wrist flexion/extension.   Neurological: He is alert.  Sensation in R distal fingertips intact and brisk cap refill in all fingertips of R hand.   Skin: He is not diaphoretic.    ED Course  Procedures (including critical  care time)  Labs Review Labs Reviewed - No data to display  Imaging Review No results found.   Visual Acuity Review  Right Eye Distance:   Left Eye Distance:   Bilateral Distance:    Right Eye Near:   Left Eye Near:    Bilateral Near:         MDM  No diagnosis found. Injury to R hand, ulnar aspect.  X-ray of R hand today shows what appears to be a recurrent fx of the R fifth metacarpal shaft with hardware.  No prior x-rays in Epic post-operatively with hardware.   I spoke with Dr Darrelyn Hillock (on call  for [facility]), who recommends wrist splint and ice, pain control, and follow up in office with Dr Amanda Pea on Monday morning (no need for appt, just report to office).   Discussed with patient, reviewed x-rays with patient.       Barbaraann Barthel, MD 12/19/15 2000  Barbaraann Barthel, MD 12/19/15 2020

## 2015-12-19 NOTE — Discharge Instructions (Signed)
It is a pleasure to see you for the injury to the right hand.  The x-ray shows what appears to be a recurrent fracture in the right hand in the fifth metacarpal bone.   I spoke with the orthopedist covering for Dr. Amanda PeaGramig about your injury.  You will be given a wrist splint to wear until you can see Dr Amanda PeaGramig on Monday morning.  You are asked to show up at his office for evaluation on Monday (April 10th).   In the meantime, ice, elevation.  I am giving you a prescription for oxycodone/acetaminophen to take for pain as needed.

## 2015-12-19 NOTE — ED Notes (Signed)
Patient states he hit his rt hand on a pole today and he is concerned it may be broken, he has broken this hand prior in October 2015. No acute distress

## 2015-12-23 ENCOUNTER — Other Ambulatory Visit: Payer: Self-pay | Admitting: Orthopedic Surgery

## 2015-12-23 ENCOUNTER — Encounter (HOSPITAL_COMMUNITY): Payer: Self-pay | Admitting: *Deleted

## 2015-12-23 NOTE — Progress Notes (Signed)
Pt denies cardiac history, chest pain or sob. 

## 2015-12-24 ENCOUNTER — Ambulatory Visit (HOSPITAL_COMMUNITY): Payer: BLUE CROSS/BLUE SHIELD | Admitting: Certified Registered"

## 2015-12-24 ENCOUNTER — Encounter: Payer: Self-pay | Admitting: Infectious Diseases

## 2015-12-24 ENCOUNTER — Encounter (HOSPITAL_COMMUNITY)
Admission: RE | Disposition: A | Discharge status: Home / Self Care | Payer: Self-pay | Source: Ambulatory Visit | Attending: Orthopedic Surgery

## 2015-12-24 ENCOUNTER — Encounter (HOSPITAL_BASED_OUTPATIENT_CLINIC_OR_DEPARTMENT_OTHER): Payer: Self-pay | Admitting: *Deleted

## 2015-12-24 ENCOUNTER — Ambulatory Visit (HOSPITAL_COMMUNITY)
Admission: RE | Admit: 2015-12-24 | Discharge: 2015-12-24 | Disposition: A | Discharge status: Home / Self Care | Payer: BLUE CROSS/BLUE SHIELD | Source: Ambulatory Visit | Attending: Orthopedic Surgery | Admitting: Orthopedic Surgery

## 2015-12-24 ENCOUNTER — Encounter (HOSPITAL_COMMUNITY): Payer: Self-pay | Admitting: *Deleted

## 2015-12-24 DIAGNOSIS — Z79899 Other long term (current) drug therapy: Secondary | ICD-10-CM | POA: Insufficient documentation

## 2015-12-24 DIAGNOSIS — B20 Human immunodeficiency virus [HIV] disease: Secondary | ICD-10-CM | POA: Insufficient documentation

## 2015-12-24 DIAGNOSIS — T84210A Breakdown (mechanical) of internal fixation device of bones of hand and fingers, initial encounter: Secondary | ICD-10-CM | POA: Insufficient documentation

## 2015-12-24 DIAGNOSIS — X58XXXA Exposure to other specified factors, initial encounter: Secondary | ICD-10-CM | POA: Insufficient documentation

## 2015-12-24 DIAGNOSIS — S62306A Unspecified fracture of fifth metacarpal bone, right hand, initial encounter for closed fracture: Secondary | ICD-10-CM | POA: Insufficient documentation

## 2015-12-24 HISTORY — PX: HARDWARE REVISION: SHX5845

## 2015-12-24 HISTORY — DX: Unspecified fracture of fifth metacarpal bone, right hand, initial encounter for closed fracture: S62.306A

## 2015-12-24 HISTORY — PX: OPEN REDUCTION INTERNAL FIXATION (ORIF) METACARPAL: SHX6234

## 2015-12-24 LAB — BASIC METABOLIC PANEL
ANION GAP: 10 (ref 5–15)
BUN: <5 mg/dL — ABNORMAL LOW (ref 6–20)
CALCIUM: 9.3 mg/dL (ref 8.9–10.3)
CO2: 25 mmol/L (ref 22–32)
Chloride: 105 mmol/L (ref 101–111)
Creatinine, Ser: 0.93 mg/dL (ref 0.61–1.24)
GFR calc Af Amer: >60 mL/min (ref >60)
GFR calc non Af Amer: >60 mL/min (ref >60)
GLUCOSE: 86 mg/dL (ref 65–99)
POTASSIUM: 4.2 mmol/L (ref 3.5–5.1)
Sodium: 140 mmol/L (ref 135–145)

## 2015-12-24 LAB — CBC
HEMATOCRIT: 44.3 % (ref 39.0–52.0)
Hemoglobin: 14.1 g/dL (ref 13.0–17.0)
MCH: 26.7 pg (ref 26.0–34.0)
MCHC: 31.8 g/dL (ref 30.0–36.0)
MCV: 83.9 fL (ref 78.0–100.0)
Platelets: 260 10*3/uL (ref 150–400)
RBC: 5.28 MIL/uL (ref 4.22–5.81)
RDW: 15.4 % (ref 11.5–15.5)
WBC: 6.7 10*3/uL (ref 4.0–10.5)

## 2015-12-24 SURGERY — OPEN REDUCTION INTERNAL FIXATION (ORIF) METACARPAL
Anesthesia: General | Laterality: Right

## 2015-12-24 MED ORDER — CEPHALEXIN 500 MG PO CAPS: 500.0000 mg | ORAL_CAPSULE | Freq: Four times a day (QID) | ORAL | Status: DC

## 2015-12-24 MED ORDER — HYDROCODONE-ACETAMINOPHEN 5-325 MG PO TABS
1.0000 | ORAL_TABLET | Freq: Once | ORAL | Status: AC
  Administered 2015-12-24: 1 via ORAL

## 2015-12-24 MED ORDER — CHLORHEXIDINE GLUCONATE 4 % EX LIQD: 60.0000 mL | Freq: Once | CUTANEOUS | Status: DC

## 2015-12-24 MED ORDER — HYDROMORPHONE HCL 1 MG/ML IJ SOLN: 0.2500 mg | INTRAMUSCULAR | Status: DC | PRN

## 2015-12-24 MED ORDER — FENTANYL CITRATE (PF) 100 MCG/2ML IJ SOLN
INTRAMUSCULAR | Status: DC | PRN
  Administered 2015-12-24: 100 ug via INTRAVENOUS

## 2015-12-24 MED ORDER — HYDROCODONE-ACETAMINOPHEN 5-325 MG PO TABS
ORAL_TABLET | ORAL | Status: AC
  Filled 2015-12-24: qty 1

## 2015-12-24 MED ORDER — PROPOFOL 10 MG/ML IV BOLUS
INTRAVENOUS | Status: DC | PRN
  Administered 2015-12-24: 200 mg via INTRAVENOUS

## 2015-12-24 MED ORDER — HYDROCODONE-ACETAMINOPHEN 5-325 MG PO TABS: 2.0000 | ORAL_TABLET | ORAL | Status: DC | PRN

## 2015-12-24 MED ORDER — MIDAZOLAM HCL 5 MG/5ML IJ SOLN
INTRAMUSCULAR | Status: DC | PRN
  Administered 2015-12-24: 2 mg via INTRAVENOUS

## 2015-12-24 MED ORDER — LIDOCAINE HCL (CARDIAC) 20 MG/ML IV SOLN
INTRAVENOUS | Status: AC
  Filled 2015-12-24: qty 5

## 2015-12-24 MED ORDER — CEFAZOLIN SODIUM-DEXTROSE 2-4 GM/100ML-% IV SOLN
2.0000 g | INTRAVENOUS | Status: AC
  Administered 2015-12-24: 2 g via INTRAVENOUS
  Filled 2015-12-24: qty 100

## 2015-12-24 MED ORDER — LIDOCAINE HCL (CARDIAC) 20 MG/ML IV SOLN
INTRAVENOUS | Status: DC | PRN
  Administered 2015-12-24: 60 mg via INTRAVENOUS

## 2015-12-24 MED ORDER — PROPOFOL 10 MG/ML IV BOLUS
INTRAVENOUS | Status: AC
  Filled 2015-12-24: qty 20

## 2015-12-24 MED ORDER — LACTATED RINGERS IV SOLN
INTRAVENOUS | Status: DC
  Administered 2015-12-24: 15:00:00 via INTRAVENOUS

## 2015-12-24 MED ORDER — MIDAZOLAM HCL 2 MG/2ML IJ SOLN
INTRAMUSCULAR | Status: AC
  Filled 2015-12-24: qty 2

## 2015-12-24 MED ORDER — 0.9 % SODIUM CHLORIDE (POUR BTL) OPTIME
TOPICAL | Status: DC | PRN
  Administered 2015-12-24: 1000 mL

## 2015-12-24 MED ORDER — FENTANYL CITRATE (PF) 250 MCG/5ML IJ SOLN
INTRAMUSCULAR | Status: AC
  Filled 2015-12-24: qty 5

## 2015-12-24 MED ORDER — ONDANSETRON HCL 4 MG/2ML IJ SOLN
INTRAMUSCULAR | Status: DC | PRN
  Administered 2015-12-24: 4 mg via INTRAVENOUS

## 2015-12-24 MED ORDER — BUPIVACAINE HCL (PF) 0.25 % IJ SOLN
INTRAMUSCULAR | Status: AC
  Filled 2015-12-24: qty 30

## 2015-12-24 MED ORDER — PHENYLEPHRINE HCL 10 MG/ML IJ SOLN
INTRAMUSCULAR | Status: DC | PRN
  Administered 2015-12-24: 40 ug via INTRAVENOUS

## 2015-12-24 SURGICAL SUPPLY — 52 items
BANDAGE ACE 4X5 VEL STRL LF (GAUZE/BANDAGES/DRESSINGS) ×2 IMPLANT
BANDAGE ELASTIC 3 VELCRO ST LF (GAUZE/BANDAGES/DRESSINGS) ×2 IMPLANT
BANDAGE ELASTIC 4 VELCRO ST LF (GAUZE/BANDAGES/DRESSINGS) ×2 IMPLANT
BLADE SURG ROTATE 9660 (MISCELLANEOUS) IMPLANT
BNDG COHESIVE 4X5 TAN STRL (GAUZE/BANDAGES/DRESSINGS) ×2 IMPLANT
BNDG ESMARK 4X9 LF (GAUZE/BANDAGES/DRESSINGS) ×2 IMPLANT
BNDG GAUZE ELAST 4 BULKY (GAUZE/BANDAGES/DRESSINGS) ×6 IMPLANT
CORDS BIPOLAR (ELECTRODE) ×2 IMPLANT
COVER SURGICAL LIGHT HANDLE (MISCELLANEOUS) ×2 IMPLANT
CUFF TOURNIQUET SINGLE 18IN (TOURNIQUET CUFF) ×2 IMPLANT
CUFF TOURNIQUET SINGLE 24IN (TOURNIQUET CUFF) IMPLANT
DRAIN TLS ROUND 10FR (DRAIN) IMPLANT
DRAPE OEC MINIVIEW 54X84 (DRAPES) IMPLANT
DRAPE SURG 17X23 STRL (DRAPES) ×2 IMPLANT
DRSG EMULSION OIL 3X3 NADH (GAUZE/BANDAGES/DRESSINGS) ×2 IMPLANT
GAUZE SPONGE 4X4 12PLY STRL (GAUZE/BANDAGES/DRESSINGS) ×2 IMPLANT
GAUZE XEROFORM 1X8 LF (GAUZE/BANDAGES/DRESSINGS) ×2 IMPLANT
GLOVE BIOGEL M 8.0 STRL (GLOVE) ×2 IMPLANT
GLOVE SS BIOGEL STRL SZ 8 (GLOVE) ×1 IMPLANT
GLOVE SUPERSENSE BIOGEL SZ 8 (GLOVE) ×1
GOWN STRL REUS W/ TWL LRG LVL3 (GOWN DISPOSABLE) ×3 IMPLANT
GOWN STRL REUS W/ TWL XL LVL3 (GOWN DISPOSABLE) ×3 IMPLANT
GOWN STRL REUS W/TWL LRG LVL3 (GOWN DISPOSABLE) ×3
GOWN STRL REUS W/TWL XL LVL3 (GOWN DISPOSABLE) ×3
K-WIRE DBL TROCAR .062X4 ×2 IMPLANT
KIT BASIN OR (CUSTOM PROCEDURE TRAY) ×2 IMPLANT
KIT ROOM TURNOVER OR (KITS) ×2 IMPLANT
KWIRE DBL TROCAR .062X4 ×1 IMPLANT
MANIFOLD NEPTUNE II (INSTRUMENTS) ×2 IMPLANT
NEEDLE 22X1 1/2 (OR ONLY) (NEEDLE) IMPLANT
NS IRRIG 1000ML POUR BTL (IV SOLUTION) ×2 IMPLANT
PACK ORTHO EXTREMITY (CUSTOM PROCEDURE TRAY) ×2 IMPLANT
PAD ARMBOARD 7.5X6 YLW CONV (MISCELLANEOUS) ×4 IMPLANT
PAD CAST 3X4 CTTN HI CHSV (CAST SUPPLIES) ×1 IMPLANT
PAD CAST 4YDX4 CTTN HI CHSV (CAST SUPPLIES) ×1 IMPLANT
PADDING CAST ABS 4INX4YD NS (CAST SUPPLIES) ×1
PADDING CAST ABS COTTON 4X4 ST (CAST SUPPLIES) ×1 IMPLANT
PADDING CAST COTTON 3X4 STRL (CAST SUPPLIES) ×1
PADDING CAST COTTON 4X4 STRL (CAST SUPPLIES) ×1
PADDING CAST SYNTHETIC 4 (CAST SUPPLIES) ×1
PADDING CAST SYNTHETIC 4X4 STR (CAST SUPPLIES) ×1 IMPLANT
SPONGE LAP 4X18 X RAY DECT (DISPOSABLE) IMPLANT
SUT MNCRL AB 4-0 PS2 18 (SUTURE) ×2 IMPLANT
SUT PROLENE 3 0 PS 2 (SUTURE) IMPLANT
SUT VIC AB 3-0 FS2 27 (SUTURE) IMPLANT
SYR CONTROL 10ML LL (SYRINGE) IMPLANT
SYSTEM CHEST DRAIN TLS 7FR (DRAIN) IMPLANT
TOWEL OR 17X24 6PK STRL BLUE (TOWEL DISPOSABLE) ×2 IMPLANT
TOWEL OR 17X26 10 PK STRL BLUE (TOWEL DISPOSABLE) ×2 IMPLANT
TUBE CONNECTING 12X1/4 (SUCTIONS) ×2 IMPLANT
TUBE EVACUATION TLS (MISCELLANEOUS) ×2 IMPLANT
WATER STERILE IRR 1000ML POUR (IV SOLUTION) ×2 IMPLANT

## 2015-12-24 NOTE — H&P (Signed)
Francisco Mitchell is an 20 y.o. male.   Chief Complaint: right hand fracture fifth metacarpal HPI: Patient presents for evaluation and treatment of the of their upper extremity predicament. The patient denies neck, back, chest or  abdominal pain. The patient notes that they have no lower extremity problems. The patients primary complaint is noted. We are planning surgical care pathway for the upper extremity.  Past Medical History  Diagnosis Date  . AIDS (acquired immunodeficiency syndrome), CD4 <=200 ([place])   . Anal pain   . Fracture of fifth metacarpal bone of right hand     reinjury 12-19-2015  s/p  revision ORIF 12-24-2015    Past Surgical History  Procedure Laterality Date  . Hernia repair    . Fracture surgery Right     5th digit has pin 06/2014  . Orif metacarpal fracture  10/ 2015    FIFTTH METACARPAL OF RIGHT HAND    Family History  Problem Relation Age of Onset  . Hypertension Father   . Hypertension Mother    Social History:  reports that he has never smoked. He has never used smokeless tobacco. He reports that he does not drink alcohol or use illicit drugs.  Allergies:  Allergies  Allergen Reactions  . Contrast Media [Iodinated Diagnostic Agents] Rash    Medications Prior to Admission  Medication Sig Dispense Refill  . elvitegravir-cobicistat-emtricitabine-tenofovir (GENVOYA) 150-150-200-10 MG TABS tablet Take 1 tablet by mouth daily with breakfast. 90 tablet 3  . HYDROcodone-acetaminophen (NORCO/VICODIN) 5-325 MG tablet Take 1 tablet by mouth every 6 (six) hours as needed for moderate pain.    Marland Kitchen lidocaine (XYLOCAINE) 5 % ointment Apply 1 application topically 4 (four) times daily as needed. 50 g 1    Results for orders placed or performed during the hospital encounter of 12/24/15 (from the past 48 hour(s))  Basic metabolic panel     Status: Abnormal   Collection Time: 12/24/15  3:15 PM  Result Value Ref Range   Sodium 140 135 - 145 mmol/L   Potassium 4.2 3.5  - 5.1 mmol/L   Chloride 105 101 - 111 mmol/L   CO2 25 22 - 32 mmol/L   Glucose, Bld 86 65 - 99 mg/dL   BUN <5 (L) 6 - 20 mg/dL   Creatinine, Ser 0.93 0.61 - 1.24 mg/dL   Calcium 9.3 8.9 - 10.3 mg/dL   GFR calc non Af Amer >60 >60 mL/min   GFR calc Af Amer >60 >60 mL/min    Comment: (NOTE) The eGFR has been calculated using the CKD EPI equation. This calculation has not been validated in all clinical situations. eGFR's persistently <60 mL/min signify possible Chronic Kidney Disease.    Anion gap 10 5 - 15  CBC     Status: None   Collection Time: 12/24/15  3:15 PM  Result Value Ref Range   WBC 6.7 4.0 - 10.5 K/uL   RBC 5.28 4.22 - 5.81 MIL/uL   Hemoglobin 14.1 13.0 - 17.0 g/dL   HCT 44.3 39.0 - 52.0 %   MCV 83.9 78.0 - 100.0 fL   MCH 26.7 26.0 - 34.0 pg   MCHC 31.8 30.0 - 36.0 g/dL   RDW 15.4 11.5 - 15.5 %   Platelets 260 150 - 400 K/uL   No results found.  Review of Systems  Constitutional: Negative.   HENT: Negative.   Eyes: Negative.   Respiratory: Negative.   Cardiovascular: Negative.   Skin: Negative.     Blood pressure  142/90, pulse 54, temperature 97.8 F (36.6 C), temperature source Oral, resp. rate 18, height 6' 2"  (1.88 m), weight 77.111 kg (170 lb), SpO2 100 %. Physical Exam  Fifth metacarpal fracture with pain swelling and deformity right hand he's neurovascularly intact no evidence of compartment syndrome.  Left upper extremity is neurovascular intact.  The patient is alert and oriented in no acute distress. The patient complains of pain in the affected upper extremity.  The patient is noted to have a normal HEENT exam. Lung fields show equal chest expansion and no shortness of breath. Abdomen exam is nontender without distention. Lower extremity examination does not show any fracture dislocation or blood clot symptoms. Pelvis is stable and the neck and back are stable and nontender.   Assessment/Plan Plan hardware removal and revision open  reduction internal fixation right small finger metacarpal fracture (fifth metacarpal). We are planning surgery for your upper extremity. The risk and benefits of surgery to include risk of bleeding, infection, anesthesia,  damage to normal structures and failure of the surgery to accomplish its intended goals of relieving symptoms and restoring function have been discussed in detail. With this in mind we plan to proceed. I have specifically discussed with the patient the pre-and postoperative regime and the dos and don'ts and risk and benefits in great detail. Risk and benefits of surgery also include risk of dystrophy(CRPS), chronic nerve pain, failure of the healing process to go onto completion and other inherent risks of surgery The relavent the pathophysiology of the disease/injury process, as well as the alternatives for treatment and postoperative course of action has been discussed in great detail with the patient who desires to proceed.  We will do everything in our power to help you (the patient) restore function to the upper extremity. It is a pleasure to see this patient today.  Paulene Floor, MD 12/24/2015, 5:17 PM

## 2015-12-24 NOTE — Anesthesia Procedure Notes (Signed)
Procedure Name: LMA Insertion Date/Time: 12/24/2015 4:58 PM Performed by: Reine JustFLOWERS, Brittanyann Wittner T Pre-anesthesia Checklist: Patient identified, Emergency Drugs available, Suction available, Patient being monitored and Timeout performed Patient Re-evaluated:Patient Re-evaluated prior to inductionOxygen Delivery Method: Simple face mask and Circle system utilized Preoxygenation: Pre-oxygenation with 100% oxygen Intubation Type: IV induction Ventilation: Mask ventilation without difficulty LMA: LMA inserted LMA Size: 5.0 Number of attempts: 1 Airway Equipment and Method: Patient positioned with wedge pillow Placement Confirmation: positive ETCO2 and breath sounds checked- equal and bilateral Tube secured with: Tape Dental Injury: Teeth and Oropharynx as per pre-operative assessment

## 2015-12-24 NOTE — Transfer of Care (Signed)
Immediate Anesthesia Transfer of Care Note  Patient: Francisco Mitchell  Procedure(s) Performed: Procedure(s): OPEN REDUCTION INTERNAL FIXATION (ORIF) RIGHT FIFTH METACARPAL FRACTURE (Right) HARDWARE REVISION (Right)  Patient Location: PACU  Anesthesia Type:General  Level of Consciousness: awake  Airway & Oxygen Therapy: Patient Spontanous Breathing and Patient connected to nasal cannula oxygen  Post-op Assessment: Report given to RN, Post -op Vital signs reviewed and stable and Patient moving all extremities X 4  Post vital signs: stable  Last Vitals:  Filed Vitals:   12/24/15 1500 12/24/15 1815  BP: 142/90   Pulse: 54   Temp: 36.6 C 36.6 C  Resp: 18     Complications: No apparent anesthesia complications

## 2015-12-24 NOTE — Discharge Instructions (Signed)

## 2015-12-24 NOTE — Anesthesia Postprocedure Evaluation (Signed)
Anesthesia Post Note  Patient: Francisco Mitchell  Procedure(s) Performed: Procedure(s) (LRB): OPEN REDUCTION INTERNAL FIXATION (ORIF) RIGHT FIFTH METACARPAL FRACTURE (Right) HARDWARE REVISION (Right)  Patient location during evaluation: PACU Anesthesia Type: General Level of consciousness: awake and alert Pain management: pain level controlled Vital Signs Assessment: post-procedure vital signs reviewed and stable Respiratory status: spontaneous breathing, nonlabored ventilation, respiratory function stable and patient connected to nasal cannula oxygen Cardiovascular status: blood pressure returned to baseline and stable Postop Assessment: no signs of nausea or vomiting Anesthetic complications: no    Last Vitals:  Filed Vitals:   12/24/15 1834 12/24/15 1839  BP: 122/81   Pulse:  60  Temp:    Resp:  13    Last Pain:  Filed Vitals:   12/24/15 1839  PainSc: 0-No pain                 Toddrick Sanna A

## 2015-12-24 NOTE — Anesthesia Preprocedure Evaluation (Addendum)
Anesthesia Evaluation  Patient identified by MRN, date of birth, ID band Patient awake    Reviewed: Allergy & Precautions, NPO status , Patient's Chart, lab work & pertinent test results  Airway Mallampati: II  TM Distance: >3 FB Neck ROM: Full    Dental   Pulmonary    breath sounds clear to auscultation       Cardiovascular negative cardio ROS   Rhythm:Regular Rate:Normal     Neuro/Psych    GI/Hepatic negative GI ROS, Neg liver ROS,   Endo/Other  negative endocrine ROS  Renal/GU negative Renal ROS     Musculoskeletal   Abdominal   Peds  Hematology   Anesthesia Other Findings   Reproductive/Obstetrics                             Anesthesia Physical Anesthesia Plan  ASA: III  Anesthesia Plan: General   Post-op Pain Management:    Induction: Intravenous  Airway Management Planned: LMA  Additional Equipment:   Intra-op Plan:   Post-operative Plan: Extubation in OR  Informed Consent: I have reviewed the patients History and Physical, chart, labs and discussed the procedure including the risks, benefits and alternatives for the proposed anesthesia with the patient or authorized representative who has indicated his/her understanding and acceptance.   Dental advisory given  Plan Discussed with: CRNA and Anesthesiologist  Anesthesia Plan Comments:        Anesthesia Quick Evaluation

## 2015-12-24 NOTE — Op Note (Signed)
See dictation#[id number] Francisco PeaGramig MD

## 2015-12-25 NOTE — Op Note (Signed)
[name]:  [name], [name]             ACCOUNT NO.:  [id number]  MEDICAL RECORD NO.:  [id number]  LOCATION:  [facility]                         FACILITY:  [facility]  PHYSICIAN:  Dionne AnoWilliam M. Fredi Geiler, M.D.DATE OF BIRTH:  [birth year]  DATE OF PROCEDURE: DATE OF DISCHARGE:  12/24/2015                              OPERATIVE REPORT   PREOPERATIVE DIAGNOSES: 1. Acute fracture, right hand, fifth metacarpal. 2. Prior hardware placement for fracture, right fifth metacarpal with     broken hardware after a new fracture.  POSTOPERATIVE DIAGNOSES: 1. Acute fracture, right hand, fifth metacarpal. 2. Prior hardware placement for fracture, right fifth metacarpal with     broken hardware after a new fracture.  PROCEDURES: 1. Hardware removal, deep in location, right hand, fifth metacarpal. 2. Open reduction and internal fixation of right fifth metacarpal     fracture. 3. AP, lateral and oblique x-rays performed, examined and interpreted     by myself, right fifth metacarpal.  SURGEON:  Dionne AnoWilliam M. Amanda PeaGramig, M.D.  ASSISTANT:  None.  COMPLICATIONS:  None.  ANESTHESIA:  General.  TOURNIQUET TIME:  Less than an hour.  INDICATIONS FOR THE PROCEDURE:  The patient is a pleasant male, who had a distant metacarpal fracture treated by myself with surgical algorithm of care.  The patient underwent ORIF and did quite well.  Subsequent to this, the patient re-presented over year later with a fresh acute fracture.  He had re-broke his hand and the hardware now is bent and in poor repair and condition.  I have counseled he and his family in regard to risks and benefits of surgery and they desired to proceed with the above-mentioned operative intervention.  OPERATIVE PROCEDURE:  The patient was seen by myself and Anesthesia, taken to the operative theater and underwent smooth induction of general anesthetic.  He was prepped and draped in usual sterile fashion.  Time- out was called.  Preoperative antibiotics were  given.  Following this, arm was elevated, tourniquet was insufflated to 250 mmHg.  Incision was made.  Dissection was carried down.  Dorsal sensory branch of the ulnar nerve was identified, swept out of harm's way and neurolysed due to its area of scar tissue.  I then accessed the ECU, this underwent very careful and cautious incision about the sheath.  It was then retracted. I then identified the prior hardware entrance site and retrieved the hardware followed by removal of the bent hardware.  Once the hardware was removed in form of metallic IM rod, the patient then had a new rod shappened to fit the confines of a reduced fracture.  The new rod was prebent and then we set about performing an ORIF.  Intramedullary rod a proper position length and dimensions was shappened, prebent by myself and was engaged across the fracture site after entering proximally.  I seated it distally without difficulty and there were no complicating features.  Once this was done, AP, lateral and oblique x-rays were performed, examined and interpreted by myself and looked to be excellent.  I was pleased with this and the findings.  Nerve looked good.  ECU was left unharmed and there were no complicating features. We deflated the tourniquet, secured hemostasis and closed the  wound with Prolene.  He tolerated this quite well.  Adaptic, Xeroform, short-arm splint were applied without difficulty.  He was taken to the recovery room in stable condition.  He will be discharged home on Keflex 500 q.i.d. x10 days, Norco p.r.n. pain 1-2 q.4-6 hours p.r.n. pain p.o., RTC 12-14 days, we will call him for the appointments.  Should any problems occur, will notify us. Pleasure to see him today and participate in his care.  We look forward to participate in his postop recovery.     Dionne Ano. Amanda Pea, M.D.     [facility]  D:  12/24/2015  T:  12/25/2015  Job:  [id number]

## 2015-12-28 ENCOUNTER — Encounter (HOSPITAL_BASED_OUTPATIENT_CLINIC_OR_DEPARTMENT_OTHER): Payer: Self-pay | Admitting: *Deleted

## 2015-12-29 ENCOUNTER — Encounter (HOSPITAL_COMMUNITY): Payer: Self-pay | Admitting: Orthopedic Surgery

## 2015-12-29 NOTE — Progress Notes (Signed)
NPO AFTER MN.  ARRIVE AT 0800.  CURRENT LAB RESULTS IN CHART AND EPIC.  WILL TAKE GENVOYA AND HYDROCODONE IF NEEDED AM DOS W/ SIPS OF WATER.

## 2015-12-31 ENCOUNTER — Ambulatory Visit (HOSPITAL_BASED_OUTPATIENT_CLINIC_OR_DEPARTMENT_OTHER): Payer: BLUE CROSS/BLUE SHIELD | Admitting: Anesthesiology

## 2015-12-31 ENCOUNTER — Encounter (HOSPITAL_BASED_OUTPATIENT_CLINIC_OR_DEPARTMENT_OTHER): Payer: Self-pay | Admitting: *Deleted

## 2015-12-31 ENCOUNTER — Ambulatory Visit (HOSPITAL_BASED_OUTPATIENT_CLINIC_OR_DEPARTMENT_OTHER)
Admission: RE | Admit: 2015-12-31 | Discharge: 2015-12-31 | Disposition: A | Discharge status: Home / Self Care | Payer: BLUE CROSS/BLUE SHIELD | Source: Ambulatory Visit | Attending: General Surgery | Admitting: General Surgery

## 2015-12-31 ENCOUNTER — Encounter (HOSPITAL_BASED_OUTPATIENT_CLINIC_OR_DEPARTMENT_OTHER)
Admission: RE | Disposition: A | Discharge status: Home / Self Care | Payer: Self-pay | Source: Ambulatory Visit | Attending: General Surgery

## 2015-12-31 DIAGNOSIS — K610 Anal abscess: Secondary | ICD-10-CM | POA: Diagnosis not present

## 2015-12-31 DIAGNOSIS — K6289 Other specified diseases of anus and rectum: Secondary | ICD-10-CM | POA: Diagnosis present

## 2015-12-31 DIAGNOSIS — B20 Human immunodeficiency virus [HIV] disease: Secondary | ICD-10-CM | POA: Insufficient documentation

## 2015-12-31 DIAGNOSIS — A630 Anogenital (venereal) warts: Secondary | ICD-10-CM | POA: Insufficient documentation

## 2015-12-31 DIAGNOSIS — Z79899 Other long term (current) drug therapy: Secondary | ICD-10-CM | POA: Diagnosis not present

## 2015-12-31 HISTORY — DX: Pain in right hand: M79.641

## 2015-12-31 HISTORY — DX: Other specified diseases of anus and rectum: K62.89

## 2015-12-31 HISTORY — PX: EVALUATION UNDER ANESTHESIA WITH ANAL FISTULECTOMY: SHX5621

## 2015-12-31 SURGERY — EXAM UNDER ANESTHESIA WITH ANAL FISTULECTOMY
Anesthesia: Monitor Anesthesia Care

## 2015-12-31 MED ORDER — KETAMINE HCL 10 MG/ML IJ SOLN
INTRAMUSCULAR | Status: DC | PRN
  Administered 2015-12-31: 50 mg via INTRAVENOUS

## 2015-12-31 MED ORDER — PROPOFOL 10 MG/ML IV BOLUS
INTRAVENOUS | Status: DC | PRN
  Administered 2015-12-31: 50 mg via INTRAVENOUS

## 2015-12-31 MED ORDER — LACTATED RINGERS IV SOLN
INTRAVENOUS | Status: DC
  Administered 2015-12-31: 09:00:00 via INTRAVENOUS
  Filled 2015-12-31: qty 1000

## 2015-12-31 MED ORDER — ONDANSETRON HCL 4 MG/2ML IJ SOLN
INTRAMUSCULAR | Status: AC
  Filled 2015-12-31: qty 2

## 2015-12-31 MED ORDER — MIDAZOLAM HCL 2 MG/2ML IJ SOLN
INTRAMUSCULAR | Status: AC
  Filled 2015-12-31: qty 2

## 2015-12-31 MED ORDER — MEPERIDINE HCL 25 MG/ML IJ SOLN
6.2500 mg | INTRAMUSCULAR | Status: DC | PRN
  Filled 2015-12-31: qty 1

## 2015-12-31 MED ORDER — PROPOFOL 500 MG/50ML IV EMUL
INTRAVENOUS | Status: AC
  Filled 2015-12-31: qty 50

## 2015-12-31 MED ORDER — FENTANYL CITRATE (PF) 100 MCG/2ML IJ SOLN
INTRAMUSCULAR | Status: DC | PRN
  Administered 2015-12-31 (×4): 25 ug via INTRAVENOUS

## 2015-12-31 MED ORDER — ONDANSETRON HCL 4 MG/2ML IJ SOLN
INTRAMUSCULAR | Status: DC | PRN
  Administered 2015-12-31: 4 mg via INTRAVENOUS

## 2015-12-31 MED ORDER — MIDAZOLAM HCL 5 MG/5ML IJ SOLN
INTRAMUSCULAR | Status: DC | PRN
  Administered 2015-12-31: 1 mg via INTRAVENOUS
  Administered 2015-12-31: 2 mg via INTRAVENOUS

## 2015-12-31 MED ORDER — FENTANYL CITRATE (PF) 100 MCG/2ML IJ SOLN
INTRAMUSCULAR | Status: AC
  Filled 2015-12-31: qty 2

## 2015-12-31 MED ORDER — PROPOFOL 500 MG/50ML IV EMUL
INTRAVENOUS | Status: DC | PRN
  Administered 2015-12-31: 75 ug/kg/min via INTRAVENOUS

## 2015-12-31 MED ORDER — HYDROCODONE-ACETAMINOPHEN 5-325 MG PO TABS: 1.0000 | ORAL_TABLET | ORAL | Status: DC | PRN

## 2015-12-31 MED ORDER — ONDANSETRON HCL 4 MG/2ML IJ SOLN
4.0000 mg | Freq: Once | INTRAMUSCULAR | Status: DC | PRN
  Filled 2015-12-31: qty 2

## 2015-12-31 MED ORDER — HYDROMORPHONE HCL 1 MG/ML IJ SOLN
0.2500 mg | INTRAMUSCULAR | Status: DC | PRN
  Filled 2015-12-31: qty 1

## 2015-12-31 MED ORDER — HYDROCODONE-ACETAMINOPHEN 7.5-325 MG PO TABS
1.0000 | ORAL_TABLET | Freq: Once | ORAL | Status: DC | PRN
  Filled 2015-12-31: qty 1

## 2015-12-31 SURGICAL SUPPLY — 57 items
BENZOIN TINCTURE PRP APPL 2/3 (GAUZE/BANDAGES/DRESSINGS) ×4 IMPLANT
BLADE HEX COATED 2.75 (ELECTRODE) ×2 IMPLANT
BLADE SURG 15 STRL LF DISP TIS (BLADE) ×1 IMPLANT
BLADE SURG 15 STRL SS (BLADE) ×1
BRIEF STRETCH FOR OB PAD LRG (UNDERPADS AND DIAPERS) ×4 IMPLANT
CANISTER SUCTION 2500CC (MISCELLANEOUS) ×2 IMPLANT
COVER BACK TABLE 60X90IN (DRAPES) ×2 IMPLANT
COVER MAYO STAND STRL (DRAPES) ×2 IMPLANT
DECANTER SPIKE VIAL GLASS SM (MISCELLANEOUS) ×2 IMPLANT
DRAPE LAPAROTOMY 100X72 PEDS (DRAPES) ×2 IMPLANT
DRAPE LG THREE QUARTER DISP (DRAPES) IMPLANT
DRAPE UNDERBUTTOCKS STRL (DRAPE) IMPLANT
DRAPE UTILITY XL STRL (DRAPES) ×2 IMPLANT
DRSG PAD ABDOMINAL 8X10 ST (GAUZE/BANDAGES/DRESSINGS) ×2 IMPLANT
ELECT BLADE 6.5 .24CM SHAFT (ELECTRODE) IMPLANT
ELECT REM PT RETURN 9FT ADLT (ELECTROSURGICAL) ×2
ELECTRODE REM PT RTRN 9FT ADLT (ELECTROSURGICAL) ×1 IMPLANT
GAUZE SPONGE 4X4 16PLY XRAY LF (GAUZE/BANDAGES/DRESSINGS) IMPLANT
GAUZE VASELINE 3X9 (GAUZE/BANDAGES/DRESSINGS) IMPLANT
GLOVE BIO SURGEON STRL SZ 6.5 (GLOVE) ×4 IMPLANT
GLOVE INDICATOR 7.0 STRL GRN (GLOVE) ×4 IMPLANT
GOWN STRL REUS W/ TWL LRG LVL3 (GOWN DISPOSABLE) ×1 IMPLANT
GOWN STRL REUS W/ TWL XL LVL3 (GOWN DISPOSABLE) ×2 IMPLANT
GOWN STRL REUS W/TWL LRG LVL3 (GOWN DISPOSABLE) ×1
GOWN STRL REUS W/TWL XL LVL3 (GOWN DISPOSABLE) ×2
HOOK RETRACTION 12 ELAST STAY (MISCELLANEOUS) IMPLANT
HYDROGEN PEROXIDE 16OZ (MISCELLANEOUS) ×2 IMPLANT
KIT ROOM TURNOVER WOR (KITS) ×2 IMPLANT
LEGGING LITHOTOMY PAIR STRL (DRAPES) IMPLANT
LOOP VESSEL MAXI BLUE (MISCELLANEOUS) ×2 IMPLANT
MANIFOLD NEPTUNE II (INSTRUMENTS) IMPLANT
NDL SAFETY ECLIPSE 18X1.5 (NEEDLE) IMPLANT
NEEDLE HYPO 18GX1.5 SHARP (NEEDLE)
NEEDLE HYPO 25X1 1.5 SAFETY (NEEDLE) ×2 IMPLANT
NS IRRIG 500ML POUR BTL (IV SOLUTION) ×2 IMPLANT
PACK BASIN DAY SURGERY FS (CUSTOM PROCEDURE TRAY) ×2 IMPLANT
PAD ABD 8X10 STRL (GAUZE/BANDAGES/DRESSINGS) ×2 IMPLANT
PAD ARMBOARD 7.5X6 YLW CONV (MISCELLANEOUS) ×2 IMPLANT
PENCIL BUTTON HOLSTER BLD 10FT (ELECTRODE) ×2 IMPLANT
RETRACTOR STERILE 25.8CMX11.3 (INSTRUMENTS) IMPLANT
SPONGE GAUZE 4X4 12PLY STER LF (GAUZE/BANDAGES/DRESSINGS) ×2 IMPLANT
SPONGE SURGIFOAM ABS GEL 12-7 (HEMOSTASIS) IMPLANT
SUCTION FRAZIER HANDLE 10FR (MISCELLANEOUS)
SUCTION TUBE FRAZIER 10FR DISP (MISCELLANEOUS) IMPLANT
SUT CHROMIC 2 0 SH (SUTURE) ×2 IMPLANT
SUT CHROMIC 3 0 SH 27 (SUTURE) IMPLANT
SUT ETHIBOND 0 (SUTURE) ×2 IMPLANT
SUT SILK 2 0 (SUTURE)
SUT SILK 2-0 18XBRD TIE 12 (SUTURE) IMPLANT
SUT VIC AB 3-0 SH 18 (SUTURE) IMPLANT
SUT VIC AB 4-0 P-3 18XBRD (SUTURE) IMPLANT
SUT VIC AB 4-0 P3 18 (SUTURE)
SYR CONTROL 10ML LL (SYRINGE) ×2 IMPLANT
TOWEL OR 17X24 6PK STRL BLUE (TOWEL DISPOSABLE) ×2 IMPLANT
TRAY DSU PREP LF (CUSTOM PROCEDURE TRAY) ×2 IMPLANT
TUBE CONNECTING 12X1/4 (SUCTIONS) ×2 IMPLANT
YANKAUER SUCT BULB TIP NO VENT (SUCTIONS) ×2 IMPLANT

## 2015-12-31 NOTE — Interval H&P Note (Signed)
History and Physical Interval Note:  12/31/2015 8:28 AM  Francisco Mitchell  has presented today for surgery, with the diagnosis of Anal pain   The various methods of treatment have been discussed with the patient and family. After consideration of risks, benefits and other options for treatment, the patient has consented to  Procedure(s): ANAL EXAM UNDER ANESTHESIA WITH POSSIBLE I &D POSSIBLE FISTULOTOMY POSSIBLE SETON  (N/A) as a surgical intervention .  The patient's history has been reviewed, patient examined, no change in status, stable for surgery.  I have reviewed the patient's chart and labs.  Risks include prolonged infection, need for additional surgery, bleeding, pain and recurrence.  I believe the patient understands this and agrees to proceed.  Questions were answered to the patient's satisfaction.     Vanita PandaAlicia C Luian Schumpert, MD  Colorectal and General Surgery [facility]

## 2015-12-31 NOTE — H&P (View-Only) (Signed)
History of Present Illness Francisco Levee MD; 12/01/2015 2:50 PM) Patient words: fistula.  The patient is a 20 year old male who presents with anal pain. 20yo M who presents to the office with a chronically draining anal mass. This drains every few days and is very tender to palpation. He has never had this before. It seemed to all start in Jan with a larger abcess.   Other Problems Francisco Mitchell, [place]; 12/01/2015 2:36 PM) Depression Hemorrhoids HIV-positive  Diagnostic Studies History Francisco Mitchell, [place]; 12/01/2015 2:36 PM) Colonoscopy never  Allergies Francisco Mitchell, [place]; 12/01/2015 2:37 PM) Iodinated Contrast Media  Medication History Francisco Mitchell, [place]; 12/01/2015 2:37 PM) Oxycodone-Acetaminophen (5-325MG  Tablet, Oral) Active. Clindamycin HCl (  Capsule, Oral) Active. [employer] Senna Plus (8.6-50MG  Tablet, Oral) Active. Genvoya (150-150-200-10MG  Tablet, Oral) Active. Lidocaine (5% Ointment, External) Active. Sulfamethoxazole-Trimethoprim (800-160MG  Tablet, Oral) Active. Medications Reconciled  Social History Francisco Mitchell, [place]; 12/01/2015 2:36 PM) Alcohol use Occasional alcohol use. Caffeine use Carbonated beverages, Coffee, Tea. Illicit drug use Uses socially only. Tobacco use Never smoker.  Family History Francisco Mitchell, [place]; 12/01/2015 2:36 PM) First Degree Relatives No pertinent family history    Review of Systems Francisco Mitchell CMA; 12/01/2015 2:37 PM) General Present- Appetite Loss, Chills, Fatigue, Fever, Night Sweats and Weight Loss. Not Present- Weight Gain. Skin Present- Rash. Not Present- Change in Wart/Mole, Dryness, Hives, Jaundice, New Lesions, Non-Healing Wounds and Ulcer. HEENT Present- Seasonal Allergies. Not Present- Earache, Hearing Loss, Hoarseness, Nose Bleed, Oral Ulcers, Ringing in the Ears, Sinus Pain, Sore Throat, Visual Disturbances, Wears glasses/contact lenses and Yellow Eyes. Respiratory Not  Present- Bloody sputum, Chronic Cough, Difficulty Breathing, Snoring and Wheezing. Cardiovascular Not Present- Chest Pain, Difficulty Breathing Lying Down, Leg Cramps, Palpitations, Rapid Heart Rate, Shortness of Breath and Swelling of Extremities. Gastrointestinal Present- Bloody Stool, Change in Bowel Habits, Chronic diarrhea, Constipation, Excessive gas, Gets full quickly at meals, Hemorrhoids and Rectal Pain. Not Present- Abdominal Pain, Bloating, Difficulty Swallowing, Indigestion, Nausea and Vomiting. Male Genitourinary Present- Change in Urinary Stream. Not Present- Blood in Urine, Frequency, Impotence, Nocturia, Painful Urination, Urgency and Urine Leakage. Musculoskeletal Present- Back Pain, Joint Stiffness and Muscle Weakness. Not Present- Joint Pain, Muscle Pain and Swelling of Extremities. Neurological Not Present- Decreased Memory, Fainting, Headaches, Numbness, Seizures, Tingling, Tremor, Trouble walking and Weakness. Psychiatric Present- Depression. Not Present- Anxiety, Bipolar, Change in Sleep Pattern, Fearful and Frequent crying. Endocrine Present- Hot flashes. Not Present- Cold Intolerance, Excessive Hunger, Hair Changes, Heat Intolerance and New Diabetes. Hematology Present- HIV. Not Present- Easy Bruising, Excessive bleeding, Gland problems and Persistent Infections.  Vitals [employer] R. Mitchell CMA; 12/01/2015 2:36 PM) 12/01/2015 2:36 PM Weight: 166.25 lb Height: 74in Body Surface Area: 2.01 m Body Mass Index: 21.35 kg/m  Temp.: 98.23F(Oral)  Pulse: 78 (Regular)  BP: 100/62 (Sitting, Left Arm, Standard)       Physical Exam Francisco Levee MD; 12/01/2015 2:52 PM) General Mental Status-Alert. General Appearance-Not in acute distress. Build & Nutrition-Well nourished. Posture-Normal posture. Gait-Normal.  Head and Neck Head-normocephalic, atraumatic with no lesions or palpable masses. Trachea-midline.  Chest and Lung Exam Chest and lung  exam reveals -on auscultation, normal breath sounds, no adventitious sounds and normal vocal resonance.  Cardiovascular Cardiovascular examination reveals -normal heart sounds, regular rate and rhythm with no murmurs.  Abdomen Inspection Inspection of the abdomen reveals - No Hernias. Palpation/Percussion Palpation and Percussion of the abdomen reveal - Soft, Non Tender, No Rigidity (guarding), No hepatosplenomegaly and No Palpable  abdominal masses.  Rectal Anorectal Exam External - Note: mass in L anterior perianal region with slight fluctuance and minimal tenderness to palpation.  Neurologic Neurologic evaluation reveals -alert and oriented x 3 with no impairment of recent or remote memory, normal attention span and ability to concentrate, normal sensation and normal coordination.  Musculoskeletal Normal Exam - Bilateral-Upper Extremity Strength Normal and Lower Extremity Strength Normal.    Assessment & Plan Francisco Mitchell(Francisco Segundo MD; 12/01/2015 2:51 PM) ANAL FISTULA (K60.3) Impression: 20yo M with newly diagnosed HIV who presents to the office with a presumed anal fistula. I have recommended an EUA once his CD4 counts recover. This could be a fistula or chronic abscess that has not healed due to his immunosuppresion.

## 2015-12-31 NOTE — Anesthesia Postprocedure Evaluation (Signed)
Anesthesia Post Note  Patient: Francisco Mitchell  Procedure(s) Performed: Procedure(s) (LRB): ANAL EXAM UNDER ANESTHESIA WITH FISTULOTOMY (N/A)  Patient location during evaluation: PACU Anesthesia Type: MAC Level of consciousness: awake and alert and oriented Pain management: pain level controlled Vital Signs Assessment: post-procedure vital signs reviewed and stable Respiratory status: spontaneous breathing, nonlabored ventilation and respiratory function stable Cardiovascular status: blood pressure returned to baseline and stable Postop Assessment: no signs of nausea or vomiting Anesthetic complications: no    Last Vitals:  Filed Vitals:   12/31/15 1129 12/31/15 1130  BP: 121/74   Pulse: 56 70  Temp:    Resp: 10 15    Last Pain:  Filed Vitals:   12/31/15 1134  PainSc: Asleep                 Casia Corti A.

## 2015-12-31 NOTE — Transfer of Care (Signed)
   Last Vitals:  Filed Vitals:   12/31/15 0757 12/31/15 1002  BP: 123/74 120/70  Pulse: 60 56  Temp: 36.9 C 36.7 C  Resp: 16 11   Immediate Anesthesia Transfer of Care Note  Patient: Francisco Mitchell  Procedure(s) Performed: Procedure(s) (LRB): ANAL EXAM UNDER ANESTHESIA WITH FISTULOTOMY (N/A)  Patient Location: PACU  Anesthesia Type: General  Level of Consciousness: awake, alert  and oriented  Airway & Oxygen Therapy: Patient Spontanous Breathing and Patient connected to nasal cannula oxygen  Post-op Assessment: Report given to PACU RN and Post -op Vital signs reviewed and stable  Post vital signs: Reviewed and stable  Complications: No apparent anesthesia complications

## 2015-12-31 NOTE — Anesthesia Preprocedure Evaluation (Addendum)
Anesthesia Evaluation  Patient identified by MRN, date of birth, ID band Patient awake    Reviewed: Allergy & Precautions, NPO status , Patient's Chart, lab work & pertinent test results  Airway Mallampati: II  TM Distance: >3 FB Neck ROM: Full    Dental no notable dental hx. (+) Teeth Intact   Pulmonary neg pulmonary ROS,    Pulmonary exam normal breath sounds clear to auscultation       Cardiovascular negative cardio ROS Normal cardiovascular exam Rhythm:Regular Rate:Normal     Neuro/Psych negative neurological ROS  negative psych ROS   GI/Hepatic Neg liver ROS, Anal pain- possible fistula   Endo/Other  negative endocrine ROS  Renal/GU negative Renal ROS  negative genitourinary   Musculoskeletal Hx/o recent ORIF right 5th metacarpal Fx   Abdominal   Peds  Hematology HIV (+) on Rx- CD4 count less than 200   Anesthesia Other Findings   Reproductive/Obstetrics negative OB ROS                           Anesthesia Physical Anesthesia Plan  ASA: II  Anesthesia Plan: MAC   Post-op Pain Management:    Induction: Intravenous  Airway Management Planned: Natural Airway and Nasal Cannula  Additional Equipment:   Intra-op Plan:   Post-operative Plan:   Informed Consent: I have reviewed the patients History and Physical, chart, labs and discussed the procedure including the risks, benefits and alternatives for the proposed anesthesia with the patient or authorized representative who has indicated his/her understanding and acceptance.     Plan Discussed with: CRNA, Anesthesiologist and Surgeon  Anesthesia Plan Comments:         Anesthesia Quick Evaluation

## 2015-12-31 NOTE — Discharge Instructions (Addendum)

## 2015-12-31 NOTE — Anesthesia Procedure Notes (Signed)
Procedure Name: MAC Date/Time: 12/31/2015 9:27 AM Performed by: Norva PavlovALLAWAY, Francisco Olden G Pre-anesthesia Checklist: Patient identified, Timeout performed, Emergency Drugs available, Suction available and Patient being monitored Patient Re-evaluated:Patient Re-evaluated prior to inductionOxygen Delivery Method: Nasal cannula Placement Confirmation: positive ETCO2

## 2015-12-31 NOTE — Op Note (Signed)
12/31/2015  9:50 AM  PATIENT:  Francisco Mitchell  20 y.o. male  Patient Care Team: No Pcp Per Patient as PCP - General (General Practice) Campbell Riches, MD as Consulting Physician (Infectious Diseases)  PRE-OPERATIVE DIAGNOSIS:  Anal pain   POST-OPERATIVE DIAGNOSIS:  Anal abscess (chronic)  PROCEDURE:  Procedure(s): ANAL EXAM UNDER ANESTHESIA WITH I&D of CHRONIC ABSCESS  Surgeon(s): Leighton Ruff, MD  ASSISTANT: none   ANESTHESIA:   local and MAC  SPECIMEN:  No Specimen  DISPOSITION OF SPECIMEN:  N/A  COUNTS:  YES  PLAN OF CARE: Discharge to home after PACU  PATIENT DISPOSITION:  PACU - hemodynamically stable.  INDICATION: 20 y.o. M with recent diagnosis if HIV who presented to the office with a chronically draining wound.  I recommended an exam under anesthesia with I&D and possible fistulotomy.   OR FINDINGS: Large chronic abscess in left lateral perianal region with fistula tract heading towards the anal canal but not traversing the internal sphincter.  Significant anal canal condyloma and perianal condyloma.  DESCRIPTION: the patient was identified in the preoperative holding area and taken to the OR where they were laid on the operating room table.  MAC anesthesia was induced without difficulty. The patient was then positioned in prone jackknife position with buttocks gently taped apart.  The patient was then prepped and draped in usual sterile fashion.  SCDs were noted to be in place prior to the initiation of anesthesia. A surgical timeout was performed indicating the correct patient, procedure, positioning and need for preoperative antibiotics.  A rectal block was performed using Marcaine with epinephrine.    I began with a digital rectal exam.  Multiple, medium sized anal condyloma were noted within the anal canal.  I then placed a Hill-Ferguson anoscope into the anal canal and evaluated this completely.  Condyloma could be visualized mostly anteriorly in the anal  canal and this is continued into the perianal region. No obvious internal opening could be visualized. I then placed an S shaped fistula probe into the draining wound. This extended laterally to both the posterior and anterior regions. This was opened using Bovie electrocautery. I then identified a small fistula tract going deep and towards the anal canal. I placed a fistula probe into this and it met quite a bit of resistance at the level of the internal sphincter. I injected hydrogen peroxide into this area and there was no leakage into the anal canal consistent with no patent fistula. I irrigated this with normal saline. I decided not to perform a fistulotomy. The abscess cavity was debrided using electrocautery. It was lightly packed with calls and a dressing was applied. The patient was then awakened from anesthesia and sent to the post anesthesia care unit in stable condition. All counts were correct per operating room staff.

## 2016-01-01 ENCOUNTER — Encounter (HOSPITAL_BASED_OUTPATIENT_CLINIC_OR_DEPARTMENT_OTHER): Payer: Self-pay | Admitting: General Surgery

## 2016-01-18 ENCOUNTER — Other Ambulatory Visit: Payer: BLUE CROSS/BLUE SHIELD

## 2016-02-01 ENCOUNTER — Encounter: Payer: Self-pay | Admitting: Infectious Diseases

## 2016-02-01 ENCOUNTER — Ambulatory Visit (INDEPENDENT_AMBULATORY_CARE_PROVIDER_SITE_OTHER): Payer: BLUE CROSS/BLUE SHIELD | Admitting: Infectious Diseases

## 2016-02-01 VITALS — BP 136/76 | HR 77 | Temp 98.7°F | Wt 180.0 lb

## 2016-02-01 DIAGNOSIS — Z23 Encounter for immunization: Secondary | ICD-10-CM | POA: Diagnosis not present

## 2016-02-01 DIAGNOSIS — Z113 Encounter for screening for infections with a predominantly sexual mode of transmission: Secondary | ICD-10-CM | POA: Diagnosis not present

## 2016-02-01 DIAGNOSIS — B20 Human immunodeficiency virus [HIV] disease: Secondary | ICD-10-CM

## 2016-02-01 DIAGNOSIS — Z79899 Other long term (current) drug therapy: Secondary | ICD-10-CM | POA: Diagnosis not present

## 2016-02-01 DIAGNOSIS — K603 Anal fistula: Secondary | ICD-10-CM | POA: Diagnosis not present

## 2016-02-01 LAB — CBC
HEMATOCRIT: 42 % (ref 38.5–50.0)
HEMOGLOBIN: 13.7 g/dL (ref 13.2–17.1)
MCH: 27.9 pg (ref 27.0–33.0)
MCHC: 32.6 g/dL (ref 32.0–36.0)
MCV: 85.5 fL (ref 80.0–100.0)
MPV: 9 fL (ref 7.5–12.5)
PLATELETS: 255 10*3/uL (ref 140–400)
RBC: 4.91 MIL/uL (ref 4.20–5.80)
RDW: 16.6 % — ABNORMAL HIGH (ref 11.0–15.0)
WBC: 5.1 10*3/uL (ref 3.8–10.8)

## 2016-02-01 LAB — COMPREHENSIVE METABOLIC PANEL
ALBUMIN: 3.8 g/dL (ref 3.6–5.1)
ALK PHOS: 102 U/L (ref 40–115)
ALT: 22 U/L (ref 9–46)
AST: 18 U/L (ref 10–40)
BUN: 7 mg/dL (ref 7–25)
CO2: 27 mmol/L (ref 20–31)
Calcium: 9.1 mg/dL (ref 8.6–10.3)
Chloride: 104 mmol/L (ref 98–110)
Creat: 1.02 mg/dL (ref 0.60–1.35)
Glucose, Bld: 66 mg/dL (ref 65–99)
POTASSIUM: 4 mmol/L (ref 3.5–5.3)
Sodium: 138 mmol/L (ref 135–146)
TOTAL PROTEIN: 7.3 g/dL (ref 6.1–8.1)
Total Bilirubin: 0.2 mg/dL (ref 0.2–1.2)

## 2016-02-01 LAB — LIPID PANEL
Cholesterol: 118 mg/dL — ABNORMAL LOW (ref 125–200)
HDL: 47 mg/dL (ref >=40)
LDL CALC: 57 mg/dL (ref <130)
TRIGLYCERIDES: 69 mg/dL (ref <150)
Total CHOL/HDL Ratio: 2.5 Ratio (ref <=5.0)
VLDL: 14 mg/dL (ref <30)

## 2016-02-01 NOTE — Assessment & Plan Note (Signed)
Has been repaired by Dr Maisie Fushomas. My great appreciation to her.

## 2016-02-01 NOTE — Addendum Note (Signed)
Addended by: Wendall MolaOCKERHAM, Glynn Freas A on: 02/01/2016 12:20 PM   Modules accepted: Orders

## 2016-02-01 NOTE — Progress Notes (Signed)
   Subjective:    Patient ID: Francisco OchoaDreshaun R Mitchum, male    DOB: [birth year], 20 y.o.   MRN: [id number]  HPI 20 yo M with newly dx HIV+ on 07-16-15. Seen March 2017 and started on genvoya, bactrim.  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 No problems with this since.  Has been feeling well.  Has had no problems with ART. Has gained his wt back.  Entering Sr yr of college.   HIV 1 RNA QUANT (copies/mL)  Date Value  11/05/2015 102407*   CD4 T CELL ABS (/uL)  Date Value  11/05/2015 90*     Review of Systems  Constitutional: Negative for appetite change and unexpected weight change.  Gastrointestinal: Negative for diarrhea and constipation.  Genitourinary: Negative for difficulty urinating and genital sores.       Objective:   Physical Exam  Constitutional: He appears well-developed and well-nourished.  HENT:  Mouth/Throat: No oropharyngeal exudate.  Eyes: EOM are normal. Pupils are equal, round, and reactive to light.  Neck: Neck supple.  Cardiovascular: Normal rate, regular rhythm and normal heart sounds.   Pulmonary/Chest: Effort normal and breath sounds normal.  Abdominal: Soft. Bowel sounds are normal. There is no tenderness.  Musculoskeletal: He exhibits no edema.  Lymphadenopathy:    He has no cervical adenopathy.      Assessment & Plan:

## 2016-02-01 NOTE — Assessment & Plan Note (Addendum)
He appears to be doing well.  Offered/refused condoms.  Will show him how ot use mychart, stop bactrim if CD4 > 200.  Next Hep B, HPV today Rpr, lipids today as well.  Will see him back in 4-5 months.

## 2016-02-02 LAB — T-HELPER CELL (CD4) - (RCID CLINIC ONLY)
CD4 % Helper T Cell: 10 % — ABNORMAL LOW (ref 33–55)
CD4 T Cell Abs: 160 /uL — ABNORMAL LOW (ref 400–2700)

## 2016-02-02 LAB — HIV-1 RNA ULTRAQUANT REFLEX TO GENTYP+

## 2016-02-03 LAB — URINE CYTOLOGY ANCILLARY ONLY
Chlamydia: NEGATIVE
Neisseria Gonorrhea: NEGATIVE

## 2016-02-03 LAB — RPR

## 2016-06-03 ENCOUNTER — Ambulatory Visit: Payer: BLUE CROSS/BLUE SHIELD

## 2016-06-20 ENCOUNTER — Other Ambulatory Visit: Payer: BLUE CROSS/BLUE SHIELD

## 2016-06-27 ENCOUNTER — Ambulatory Visit: Payer: BLUE CROSS/BLUE SHIELD | Admitting: *Deleted

## 2016-06-27 ENCOUNTER — Ambulatory Visit (INDEPENDENT_AMBULATORY_CARE_PROVIDER_SITE_OTHER): Payer: BLUE CROSS/BLUE SHIELD | Admitting: Infectious Diseases

## 2016-06-27 ENCOUNTER — Encounter: Payer: Self-pay | Admitting: Infectious Diseases

## 2016-06-27 VITALS — BP 133/77 | HR 63 | Temp 98.2°F | Ht 74.0 in | Wt 179.0 lb

## 2016-06-27 DIAGNOSIS — B20 Human immunodeficiency virus [HIV] disease: Secondary | ICD-10-CM | POA: Diagnosis not present

## 2016-06-27 DIAGNOSIS — Z79899 Other long term (current) drug therapy: Secondary | ICD-10-CM | POA: Diagnosis not present

## 2016-06-27 DIAGNOSIS — Z23 Encounter for immunization: Secondary | ICD-10-CM

## 2016-06-27 DIAGNOSIS — F32A Depression, unspecified: Secondary | ICD-10-CM

## 2016-06-27 DIAGNOSIS — Z113 Encounter for screening for infections with a predominantly sexual mode of transmission: Secondary | ICD-10-CM | POA: Diagnosis not present

## 2016-06-27 DIAGNOSIS — F329 Major depressive disorder, single episode, unspecified: Secondary | ICD-10-CM

## 2016-06-27 NOTE — BH Specialist Note (Signed)
Counselor met with Francisco Mitchell in the exam room for a warm hand off.  Patient was oriented times four with good affect and dress.  Patient was alert but soft spoken.  Patient did not talk a lot but was cooperative and answered questions accordingly.  Counselor provided support and encouragement for patient what little bit he shared.  Patient stated that he was doing ok and felt about the same as he did a year ago when first diagnosed. Patient shared that he does feel like he tries to ignore it and not think about it too much.  Counselor encouraged patient to seek counseling to further process and come to terms with his life and his diagnosis. Patient said ok.   Rolena Infante, MA, [facility]

## 2016-06-27 NOTE — Assessment & Plan Note (Signed)
He is off bactrim Check labs today ADAP renewal today.  Complete Hep A/B Has gotten flu shot.  Offered/refused condoms.  rtc in 6 months

## 2016-06-27 NOTE — Assessment & Plan Note (Signed)
Meets with jodi today.  Greatly appreciate her f/u.

## 2016-06-27 NOTE — Progress Notes (Signed)
   Subjective:    Patient ID: Francisco Mitchell, male    DOB: 06/03/1996, 20 y.o.   MRN: [id number]  HPI 20 yo M with newly dx HIV+ on 07-16-15. Seen March 2017 and started on genvoya, bactrim.  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 No problems with this since.  He had lapse in ADAP and recently off ART for 3 days.  Has been feeling well.   HIV 1 RNA Quant (copies/mL)  Date Value  02/01/2016 <20  11/05/2015 102,407 (H)   CD4 T Cell Abs (/uL)  Date Value  02/01/2016 160 (L)  11/05/2015 90 (L)   Has gotten flu shot.    Review of Systems  Constitutional: Negative for appetite change and unexpected weight change.  Gastrointestinal: Negative for constipation and diarrhea.  Genitourinary: Negative for difficulty urinating.       Objective:   Physical Exam  Constitutional: He appears well-developed and well-nourished.  HENT:  Mouth/Throat: No oropharyngeal exudate.  Eyes: EOM are normal. Pupils are equal, round, and reactive to light.  Neck: Neck supple.  Cardiovascular: Normal rate, regular rhythm and normal heart sounds.   Pulmonary/Chest: Effort normal and breath sounds normal.  Abdominal: Soft. Bowel sounds are normal. There is no tenderness. There is no rebound.  Musculoskeletal: He exhibits no edema.  Lymphadenopathy:    He has no cervical adenopathy.      Assessment & Plan:

## 2016-06-27 NOTE — Addendum Note (Signed)
Addended by: Mariea ClontsGREEN, Rasmus Preusser D on: 06/27/2016 03:26 PM   Modules accepted: Orders

## 2016-06-27 NOTE — Addendum Note (Signed)
Addended by: Alera Quevedo C on: 06/27/2016 10:28 AM   Modules accepted: Orders

## 2016-06-27 NOTE — Addendum Note (Signed)
Addended by: Wendall MolaOCKERHAM, Jabre Heo A on: 06/27/2016 04:57 PM   Modules accepted: Orders

## 2016-06-28 LAB — HIV-1 RNA QUANT-NO REFLEX-BLD
HIV 1 RNA QUANT: 59 {copies}/mL — AB (ref <20)
HIV-1 RNA Quant, Log: 1.77 Log copies/mL — ABNORMAL HIGH (ref <1.30)

## 2016-06-28 LAB — T-HELPER CELL (CD4) - (RCID CLINIC ONLY)
CD4 % Helper T Cell: 10 % — ABNORMAL LOW (ref 33–55)
CD4 T Cell Abs: 160 /uL — ABNORMAL LOW (ref 400–2700)

## 2016-06-28 LAB — RPR

## 2016-07-04 ENCOUNTER — Ambulatory Visit: Payer: BLUE CROSS/BLUE SHIELD | Admitting: Infectious Diseases

## 2016-12-02 ENCOUNTER — Other Ambulatory Visit: Payer: Self-pay | Admitting: Infectious Diseases

## 2016-12-02 ENCOUNTER — Telehealth: Payer: Self-pay | Admitting: *Deleted

## 2016-12-02 DIAGNOSIS — B20 Human immunodeficiency virus [HIV] disease: Secondary | ICD-10-CM

## 2016-12-02 NOTE — Telephone Encounter (Signed)
Patient called stating he has been out of meds x one month and wanted to know how it can affect his body. Told him it would not be good for his health to be off meds and asked if he was having any health issues, he said no. Asked why he ran out of his medication and he first said he lost his insurance. The front staff scheduled him to see Con MemosMichelle Evans on 12/07/16 to do his ADAP. He then said he was still on his parents insurance and I advised him he may just need a copay card. [facility] and he said he was going to call and speak to her. He was upset that no one called him to find out if he needed medication. He also has a lab appointment on 12/07/16.

## 2016-12-05 ENCOUNTER — Telehealth: Payer: Self-pay | Admitting: *Deleted

## 2016-12-05 NOTE — Telephone Encounter (Signed)
Con MemosMichelle Evans, medication assistant called and said she has sorted out patient's insurance and he also has money left through patient advocate assistant for his medication copay. He has been out of meds for one month and because of this Dr. Ninetta LightsHatcher wanted him seen by a provider. He is scheduled to see pharmacy when he comes in for labs on 12/07/16. Wendall MolaJacqueline Cockerham CMA

## 2016-12-07 ENCOUNTER — Ambulatory Visit (INDEPENDENT_AMBULATORY_CARE_PROVIDER_SITE_OTHER): Payer: Self-pay | Admitting: Pharmacist Clinician (PhC)/ Clinical Pharmacy Specialist

## 2016-12-07 ENCOUNTER — Other Ambulatory Visit: Payer: Self-pay

## 2016-12-07 DIAGNOSIS — Z113 Encounter for screening for infections with a predominantly sexual mode of transmission: Secondary | ICD-10-CM

## 2016-12-07 DIAGNOSIS — B20 Human immunodeficiency virus [HIV] disease: Secondary | ICD-10-CM

## 2016-12-07 DIAGNOSIS — Z23 Encounter for immunization: Secondary | ICD-10-CM

## 2016-12-07 DIAGNOSIS — Z79899 Other long term (current) drug therapy: Secondary | ICD-10-CM

## 2016-12-07 LAB — LIPID PANEL
CHOL/HDL RATIO: 2.5 ratio (ref <5.0)
CHOLESTEROL: 111 mg/dL (ref <200)
HDL: 44 mg/dL (ref >40)
LDL Cholesterol: 59 mg/dL (ref <100)
TRIGLYCERIDES: 42 mg/dL (ref <150)
VLDL: 8 mg/dL (ref <30)

## 2016-12-07 LAB — CBC
HEMATOCRIT: 45.1 % (ref 38.5–50.0)
Hemoglobin: 14.9 g/dL (ref 13.2–17.1)
MCH: 28.7 pg (ref 27.0–33.0)
MCHC: 33 g/dL (ref 32.0–36.0)
MCV: 86.9 fL (ref 80.0–100.0)
MPV: 9.6 fL (ref 7.5–12.5)
Platelets: 259 10*3/uL (ref 140–400)
RBC: 5.19 MIL/uL (ref 4.20–5.80)
RDW: 13.7 % (ref 11.0–15.0)
WBC: 6.4 10*3/uL (ref 3.8–10.8)

## 2016-12-07 LAB — COMPREHENSIVE METABOLIC PANEL
ALBUMIN: 4.1 g/dL (ref 3.6–5.1)
ALK PHOS: 116 U/L — AB (ref 40–115)
ALT: 19 U/L (ref 9–46)
AST: 26 U/L (ref 10–40)
BUN: 9 mg/dL (ref 7–25)
CALCIUM: 9.6 mg/dL (ref 8.6–10.3)
CHLORIDE: 105 mmol/L (ref 98–110)
CO2: 26 mmol/L (ref 20–31)
Creat: 1.03 mg/dL (ref 0.60–1.35)
Glucose, Bld: 94 mg/dL (ref 65–99)
POTASSIUM: 4.7 mmol/L (ref 3.5–5.3)
Sodium: 139 mmol/L (ref 135–146)
TOTAL PROTEIN: 7.8 g/dL (ref 6.1–8.1)
Total Bilirubin: 0.6 mg/dL (ref 0.2–1.2)

## 2016-12-07 MED ORDER — ELVITEG-COBIC-EMTRICIT-TENOFAF 150-150-200-10 MG PO TABS: 1.0000 | ORAL_TABLET | Freq: Every day | ORAL | 3 refills | Status: DC

## 2016-12-07 NOTE — Progress Notes (Signed)
HPI: Francisco Mitchell is a 21 y.o. male who is here for a pharmacy visit because of a recent issue with getting meds.   Allergies: Allergies  Allergen Reactions  . Contrast Media [Iodinated Diagnostic Agents] Rash    Vitals:    Past Medical History: Past Medical History:  Diagnosis Date  . AIDS (acquired immunodeficiency syndrome), CD4 <=200 (HCC)   . Anal pain   . Fracture of fifth metacarpal bone of right hand    reinjury 12-19-2015  s/p  revision ORIF 12-24-2015--  currently has half cast  . Right hand pain     Social History: Social History   Social History  . Marital status: Single    Spouse name: N/A  . Number of children: N/A  . Years of education: N/A   Social History Main Topics  . Smoking status: Never Smoker  . Smokeless tobacco: Never Used  . Alcohol use Yes     Comment: socially  . Drug use: Yes    Types: Marijuana     Comment: socially  . Sexual activity: Yes    Partners: Female, Male    Birth control/ protection: Condom     Comment: declined condoms   Other Topics Concern  . Not on file   Social History Narrative  . No narrative on file    Previous Regimen:   Current Regimen: Genvoya  Labs: HIV 1 RNA Quant (copies/mL)  Date Value  06/27/2016 59 (H)  02/01/2016 <20  11/05/2015 102,407 (H)   CD4 T Cell Abs (/uL)  Date Value  06/27/2016 160 (L)  02/01/2016 160 (L)  11/05/2015 90 (L)   Hep B S Ab (no units)  Date Value  11/05/2015 NEG   Hepatitis B Surface Ag (no units)  Date Value  11/05/2015 NEGATIVE   HCV Ab (no units)  Date Value  11/05/2015 NEGATIVE    CrCl: CrCl cannot be calculated (Patient's most recent lab result is older than the maximum 21 days allowed.).  Lipids:    Component Value Date/Time   CHOL 118 (L) 02/01/2016 1211   TRIG 69 02/01/2016 1211   HDL 47 02/01/2016 1211   CHOLHDL 2.5 02/01/2016 1211   VLDL 14 02/01/2016 1211   LDLCALC 57 02/01/2016 1211    Assessment:  Francisco Mitchell is here to see  pharmacy to sort out a recent issue with coverage to get his meds. He has been on his mom's plan, however, she got a new plan ([employer] [place]) at work. He did not know anything about it. Couldn't get his Genvoya due to a huge cost issue. Explained to him that he has to call us and let us know if he has issue with getting meds. Since his mom has a [place] plan, I'm going send his rx to [facility] to see if they can fill and I'll call him back to let him know. We can deal with copay if he has one. He has an appt coming up with Dr. Ninetta LightsHatcher. He is getting all labs today.   Recommendations:  [facility]/u with Dr. Ninetta LightsHatcher in April  Ulyses SouthwardMinh Pham, PharmD, BCPS, AAHIVP, CPP Clinical Infectious Disease Pharmacist [facility] 12/07/2016, 11:48 AM

## 2016-12-08 LAB — RPR

## 2016-12-08 LAB — URINE CYTOLOGY ANCILLARY ONLY
CHLAMYDIA, DNA PROBE: NEGATIVE
Neisseria Gonorrhea: NEGATIVE

## 2016-12-08 LAB — T-HELPER CELL (CD4) - (RCID CLINIC ONLY)
CD4 % Helper T Cell: 10 % — ABNORMAL LOW (ref 33–55)
CD4 T Cell Abs: 240 /uL — ABNORMAL LOW (ref 400–2700)

## 2016-12-09 LAB — HIV-1 RNA QUANT-NO REFLEX-BLD
HIV 1 RNA QUANT: 93200 {copies}/mL — AB
HIV-1 RNA Quant, Log: 4.97 Log copies/mL — ABNORMAL HIGH

## 2016-12-09 MED FILL — GENVOYA TABLET: 150-150-200 | 30 days supply | Qty: 30 | Fill #0

## 2016-12-21 ENCOUNTER — Ambulatory Visit: Payer: BLUE CROSS/BLUE SHIELD | Admitting: Infectious Diseases

## 2017-01-04 ENCOUNTER — Ambulatory Visit: Payer: Self-pay | Admitting: Infectious Diseases

## 2017-01-05 ENCOUNTER — Telehealth: Payer: Self-pay | Admitting: Pharmacist Clinician (PhC)/ Clinical Pharmacy Specialist

## 2017-01-05 MED FILL — GENVOYA TABLET: 150-150-200 | 30 days supply | Qty: 30 | Fill #1

## 2017-01-05 NOTE — Telephone Encounter (Signed)
i'd be glad to see him anytime in the later half of may, even as an overbook thanks

## 2017-01-05 NOTE — Telephone Encounter (Signed)
Francisco Mitchell missed his appt with Dr. Ninetta Lights yesterday. Called to see if we can reschedule it for him. No available until July. He may be home for for the summer. Schedule to see pharmacy in 2 wks so we can do labs.

## 2017-01-06 NOTE — Telephone Encounter (Signed)
Claris Che will overbook him.

## 2017-01-18 ENCOUNTER — Ambulatory Visit (INDEPENDENT_AMBULATORY_CARE_PROVIDER_SITE_OTHER): Payer: Self-pay | Admitting: Infectious Diseases

## 2017-01-18 ENCOUNTER — Other Ambulatory Visit: Payer: Self-pay | Admitting: Infectious Diseases

## 2017-01-18 ENCOUNTER — Encounter: Payer: Self-pay | Admitting: Infectious Diseases

## 2017-01-18 VITALS — BP 131/73 | HR 74 | Temp 98.3°F | Ht 75.0 in | Wt 178.0 lb

## 2017-01-18 DIAGNOSIS — B20 Human immunodeficiency virus [HIV] disease: Secondary | ICD-10-CM

## 2017-01-18 DIAGNOSIS — Z23 Encounter for immunization: Secondary | ICD-10-CM

## 2017-01-18 DIAGNOSIS — K603 Anal fistula: Secondary | ICD-10-CM

## 2017-01-18 DIAGNOSIS — F325 Major depressive disorder, single episode, in full remission: Secondary | ICD-10-CM

## 2017-01-18 NOTE — Assessment & Plan Note (Signed)
Mood has been normal.

## 2017-01-18 NOTE — Assessment & Plan Note (Signed)
Will refer him to surgery with persistent leakage.

## 2017-01-18 NOTE — Progress Notes (Signed)
   Subjective:    Patient ID: Francisco Mitchell, male    DOB: Oct 19, 1995, 21 y.o.   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 Seen March 2017 and started on genvoya, bactrim.  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 Still healing. Still has some d/c. No pain.   Has met with CPPE and gotten back on his genvoya. Has been taking regularly for > 1 month.  Is off bactrim Has had some issue with allergies, prev took allegra.    HIV 1 RNA Quant (copies/mL)  Date Value  12/07/2016 93,200 (H)  06/27/2016 59 (H)  02/01/2016 <20   CD4 T Cell Abs (/uL)  Date Value  12/07/2016 240 (L)  06/27/2016 160 (L)  02/01/2016 160 (L)    Review of Systems  Constitutional: Negative for appetite change and unexpected weight change.  Respiratory: Negative for cough and shortness of breath.   Gastrointestinal: Negative for constipation and diarrhea.  Genitourinary: Negative for difficulty urinating.  Allergic/Immunologic: Positive for environmental allergies.  Neurological: Negative for headaches.  Psychiatric/Behavioral: Negative for dysphoric mood.   Not exercising as much as prev.     Objective:   Physical Exam  Constitutional: He appears well-developed and well-nourished.  HENT:  Mouth/Throat: No oropharyngeal exudate.  Eyes: EOM are normal.  Neck: Neck supple.  Cardiovascular: Normal rate, regular rhythm and normal heart sounds.   Pulmonary/Chest: Effort normal and breath sounds normal.  Abdominal: Soft. Bowel sounds are normal. There is no tenderness. There is no rebound.  Musculoskeletal: He exhibits no edema.  Lymphadenopathy:    He has no cervical adenopathy.  Psychiatric: He has a normal mood and affect.          Assessment & Plan:

## 2017-01-18 NOTE — Addendum Note (Signed)
Addended by: Wendall MolaOCKERHAM, JACQUELINE A on: 01/18/2017 12:14 PM   Modules accepted: Orders

## 2017-01-18 NOTE — Assessment & Plan Note (Signed)
He is doing well back on meds.  He is shocked by increase in VL Will recheck today with genotypes.  rtc 3-4 months Mening today.

## 2017-01-19 ENCOUNTER — Ambulatory Visit: Payer: Self-pay

## 2017-01-19 ENCOUNTER — Telehealth: Payer: Self-pay | Admitting: *Deleted

## 2017-01-19 NOTE — Telephone Encounter (Signed)
Patient aware that he is being referred to a general surgeon; and that [facility], referral coordinator, will be contacting the patient.

## 2017-01-21 LAB — HIV-1 RNA,QN PCR W/REFLEX GENOTYPE
HIV-1 RNA, QN PCR: <1.3 Log cps/mL — ABNORMAL HIGH
HIV-1 RNA, QN PCR: <20 Copies/mL — ABNORMAL HIGH

## 2017-02-01 LAB — HIV-1 INTEGRASE GENOTYPE

## 2017-02-10 ENCOUNTER — Telehealth: Payer: Self-pay | Admitting: Pharmacist Clinician (PhC)/ Clinical Pharmacy Specialist

## 2017-02-10 ENCOUNTER — Other Ambulatory Visit: Payer: Self-pay | Admitting: Pharmacist Clinician (PhC)/ Clinical Pharmacy Specialist

## 2017-02-10 DIAGNOSIS — B20 Human immunodeficiency virus [HIV] disease: Secondary | ICD-10-CM

## 2017-02-10 MED ORDER — ELVITEG-COBIC-EMTRICIT-TENOFAF 150-150-200-10 MG PO TABS: 1.0000 | ORAL_TABLET | Freq: Every day | ORAL | 0 refills | Status: DC

## 2017-02-10 MED ORDER — ELVITEG-COBIC-EMTRICIT-TENOFAF 150-150-200-10 MG PO TABS: 1.0000 | ORAL_TABLET | Freq: Every day | ORAL | 4 refills | Status: DC

## 2017-02-10 NOTE — Telephone Encounter (Signed)
Francisco Mitchell called yesterday to tell us that his mom has changed job and insurance so he can't get any more fill because the new insurance has not kicked in yet. He is back home with his mom now for the summer. He will run out of med this Sunday. Came up with a way to bridge him. [employer]ile[employer] approved him for 30 days supply of Genvoya. [facility] [place]. I'll mail him his paper work to sign and mail back to me for [facility].

## 2017-03-16 ENCOUNTER — Telehealth: Payer: Self-pay

## 2017-03-16 DIAGNOSIS — B20 Human immunodeficiency virus [HIV] disease: Secondary | ICD-10-CM

## 2017-03-16 MED ORDER — ELVITEG-COBIC-EMTRICIT-TENOFAF 150-150-200-10 MG PO TABS: 1.0000 | ORAL_TABLET | Freq: Every day | ORAL | 2 refills | Status: DC

## 2017-03-16 NOTE — Telephone Encounter (Signed)
Patient called to say he now has insurance and will need medication called to the pharmacy .   [address] [place]   Tammy Gorden HarmsK King, [place]

## 2017-03-21 ENCOUNTER — Telehealth: Payer: Self-pay | Admitting: Pharmacy Technician

## 2017-03-21 NOTE — Telephone Encounter (Signed)
Spoke with Mr. Francisco Mitchell twice about him [facility].  I mailed the [facility] application to him twice, both addresses made it come back as vacant address.

## 2017-05-10 ENCOUNTER — Other Ambulatory Visit: Payer: Self-pay

## 2017-05-24 ENCOUNTER — Ambulatory Visit: Payer: Self-pay | Admitting: Infectious Diseases

## 2017-06-24 ENCOUNTER — Other Ambulatory Visit: Payer: Self-pay | Admitting: Infectious Diseases

## 2017-06-24 DIAGNOSIS — B20 Human immunodeficiency virus [HIV] disease: Secondary | ICD-10-CM

## 2017-09-23 IMAGING — CT CT PELVIS W/ CM
2 of 3 series · 17 of 46 positions shown, 19 images · IV contrast (APPLIED)
Comparison: None.

CLINICAL DATA: Evaluate for rectal abscess

EXAM:
CT PELVIS WITH CONTRAST
TECHNIQUE: Multidetector CT imaging of the pelvis was performed using the
standard protocol following the bolus administration of intravenous
contrast.
CONTRAST:  100mL OMNIPAQUE IOHEXOL 300 MG/ML  SOLN

[Series 2: axial st · axial · 0.84mm/px · z∈[-574,-264]mm · 14 of 72 slices shown, 16 images]
[im 5/72  soft-tissue]
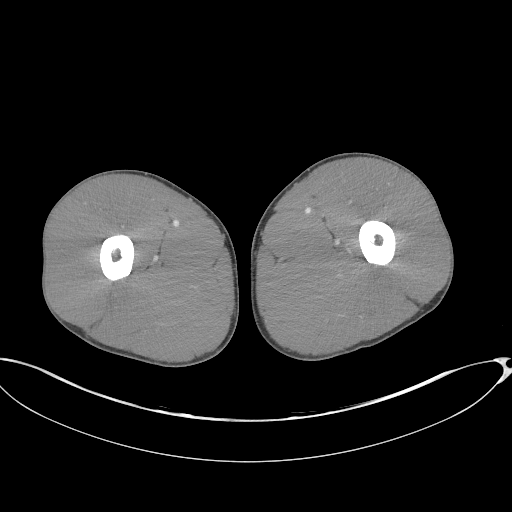
[im 5/72  bone]
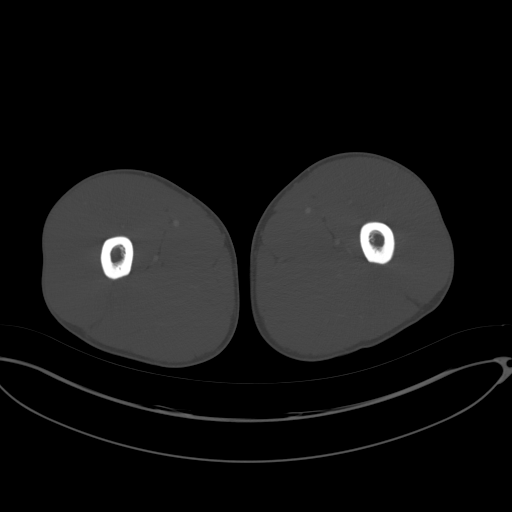
[im 10/72  soft-tissue]
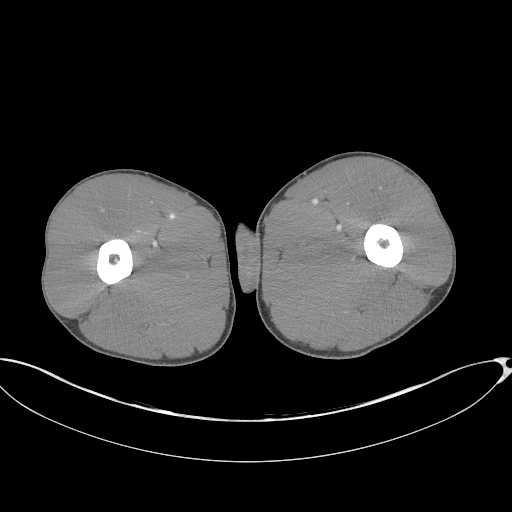
[im 14/72  soft-tissue]
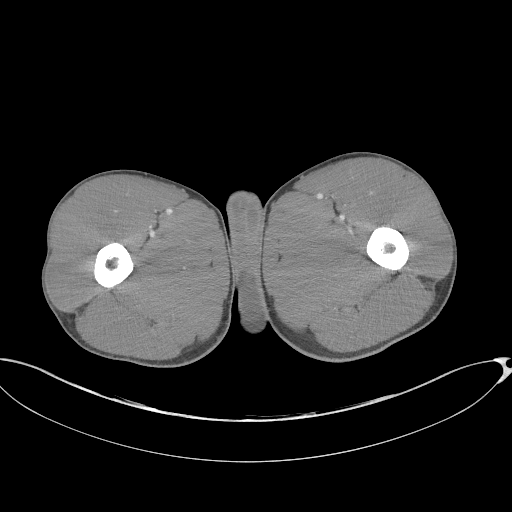
[im 19/72  soft-tissue]
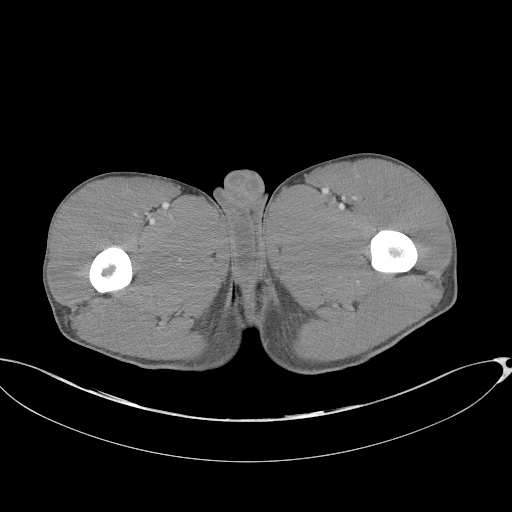
[im 23/72  soft-tissue]
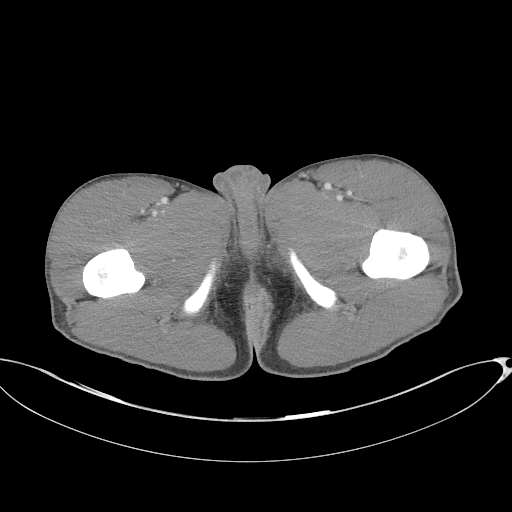
[im 28/72  soft-tissue]
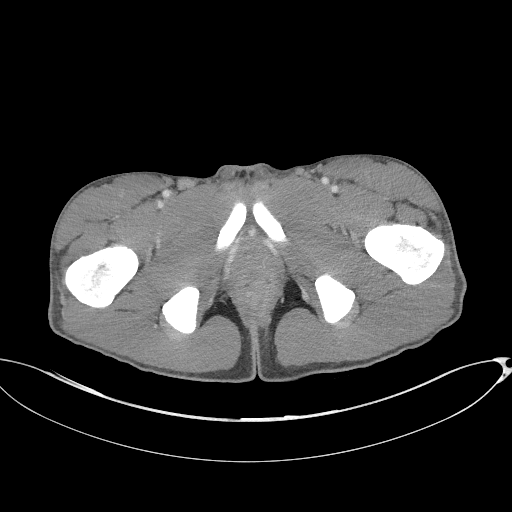
[im 33/72  soft-tissue]
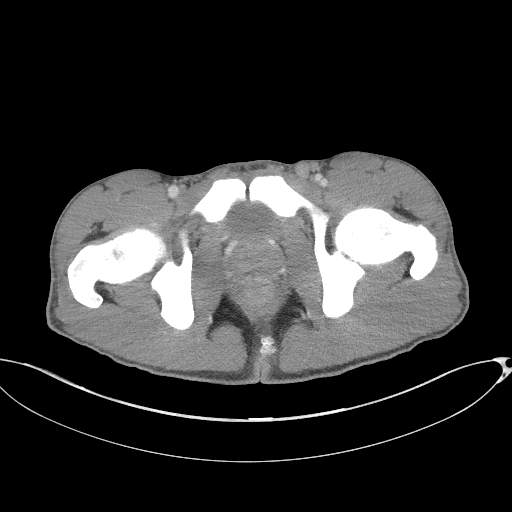
[im 39/72  soft-tissue]
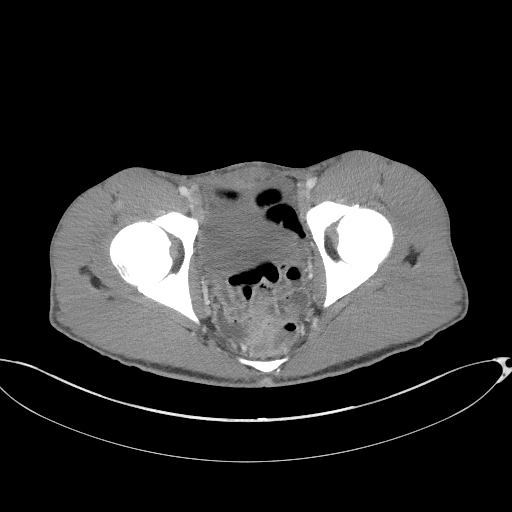
[im 44/72  soft-tissue]
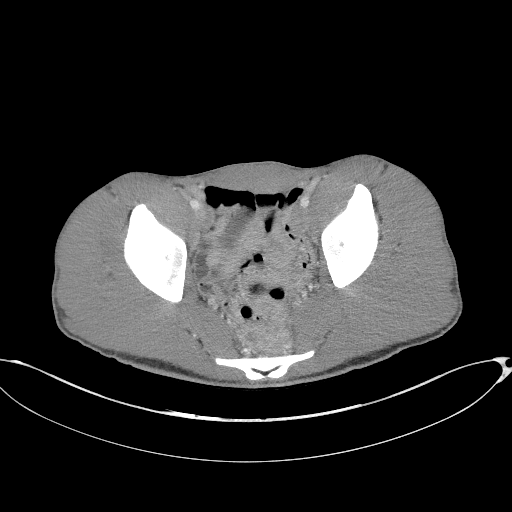
[im 44/72  bone]
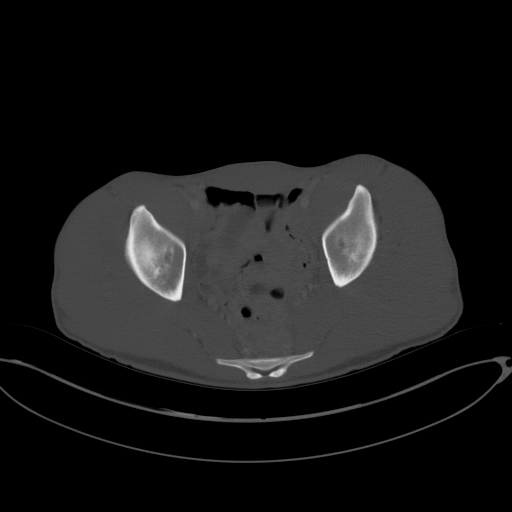
[im 49/72  soft-tissue]
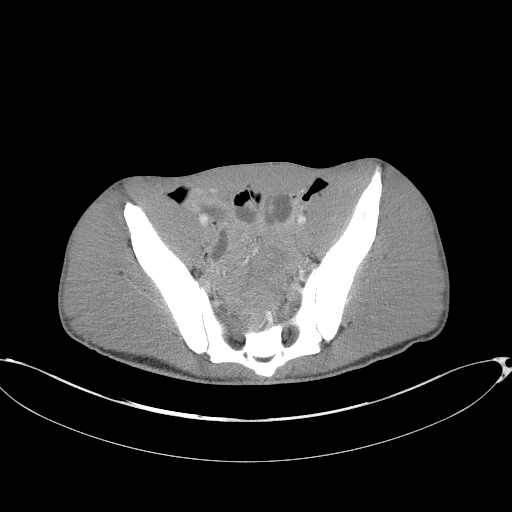
[im 53/72  soft-tissue]
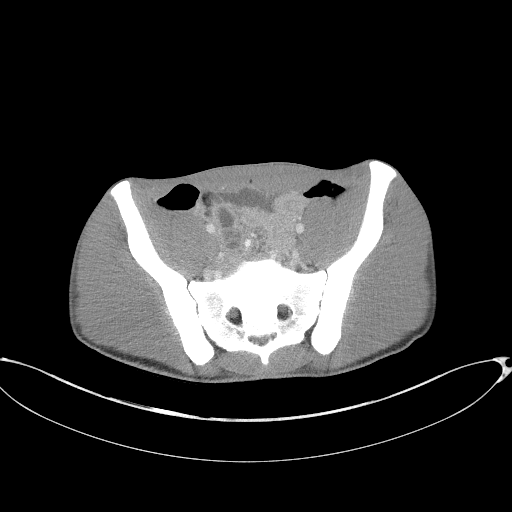
[im 58/72  soft-tissue]
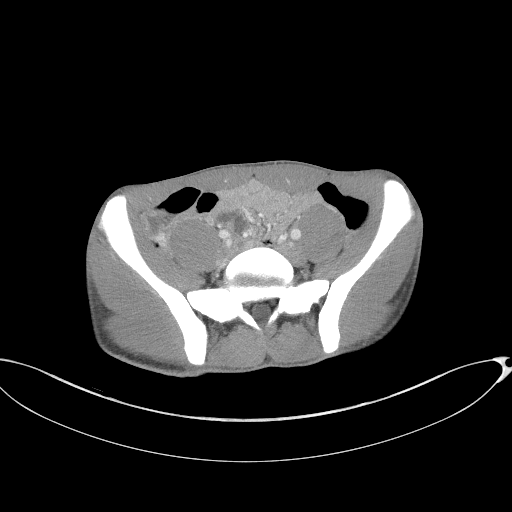
[im 62/72  soft-tissue]
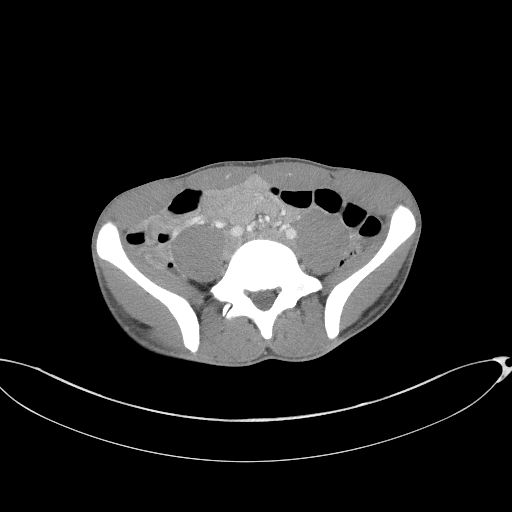
[im 67/72  soft-tissue]
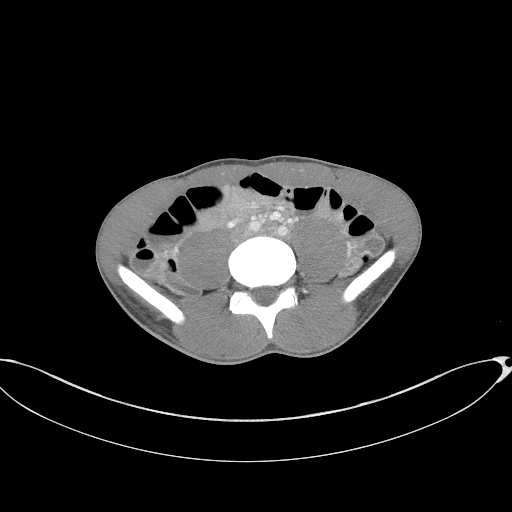

[Series 4: coronal st · coronal · 0.72mm/px · 3 of 64 slices shown]
[im 22/64  soft-tissue]
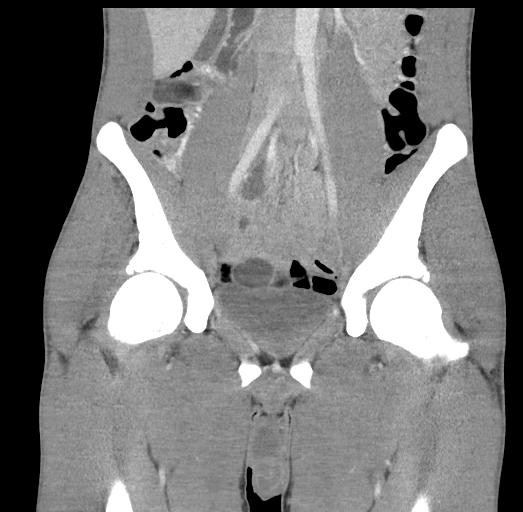
[im 29/64  soft-tissue]
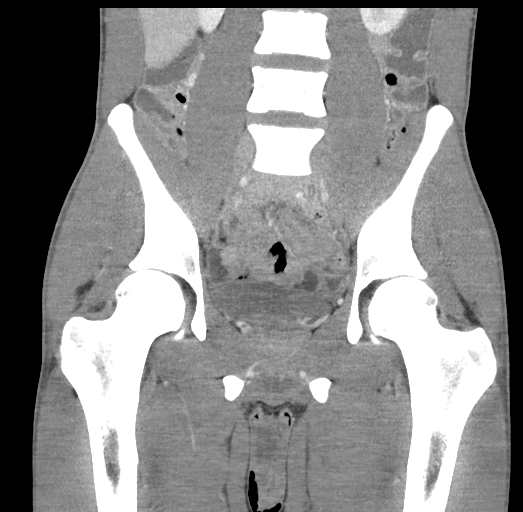
[im 36/64  soft-tissue]
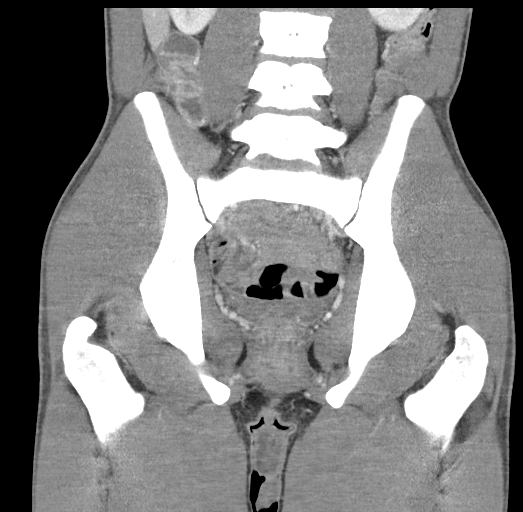

[17 of 46 positions shown; findings below may reference images not displayed]

FINDINGS: There is a flat fluid and gas containing collection in the left
perineum measuring 38 x 4 x 28 mm. This is contiguous with the 3
o'clock anorectal junction. The sphincters are not well resolved for
further characterization; outpatient MRI could further evaluate. No
abscess seen above the levator. In the pelvis, no sign of enteritis
or colitis. No sacroiliitis. Negative bladder and visible
reproductive organs.
IMPRESSION: 38 x 4 x 28 mm fistula and/or collapsed abscess extending from the
left anorectal junction.

## 2017-10-20 IMAGING — DX DG HAND COMPLETE 3+V*R*
3 series · 3 of 3 positions shown · non-contrast
Comparison: 07/01/2014.

CLINICAL DATA: Pt punched a chair today, pain on the 5th finger, pt
had a pin put in his hand in June 2014

EXAM:
RIGHT HAND - COMPLETE 3+ VIEW

[hand pa]
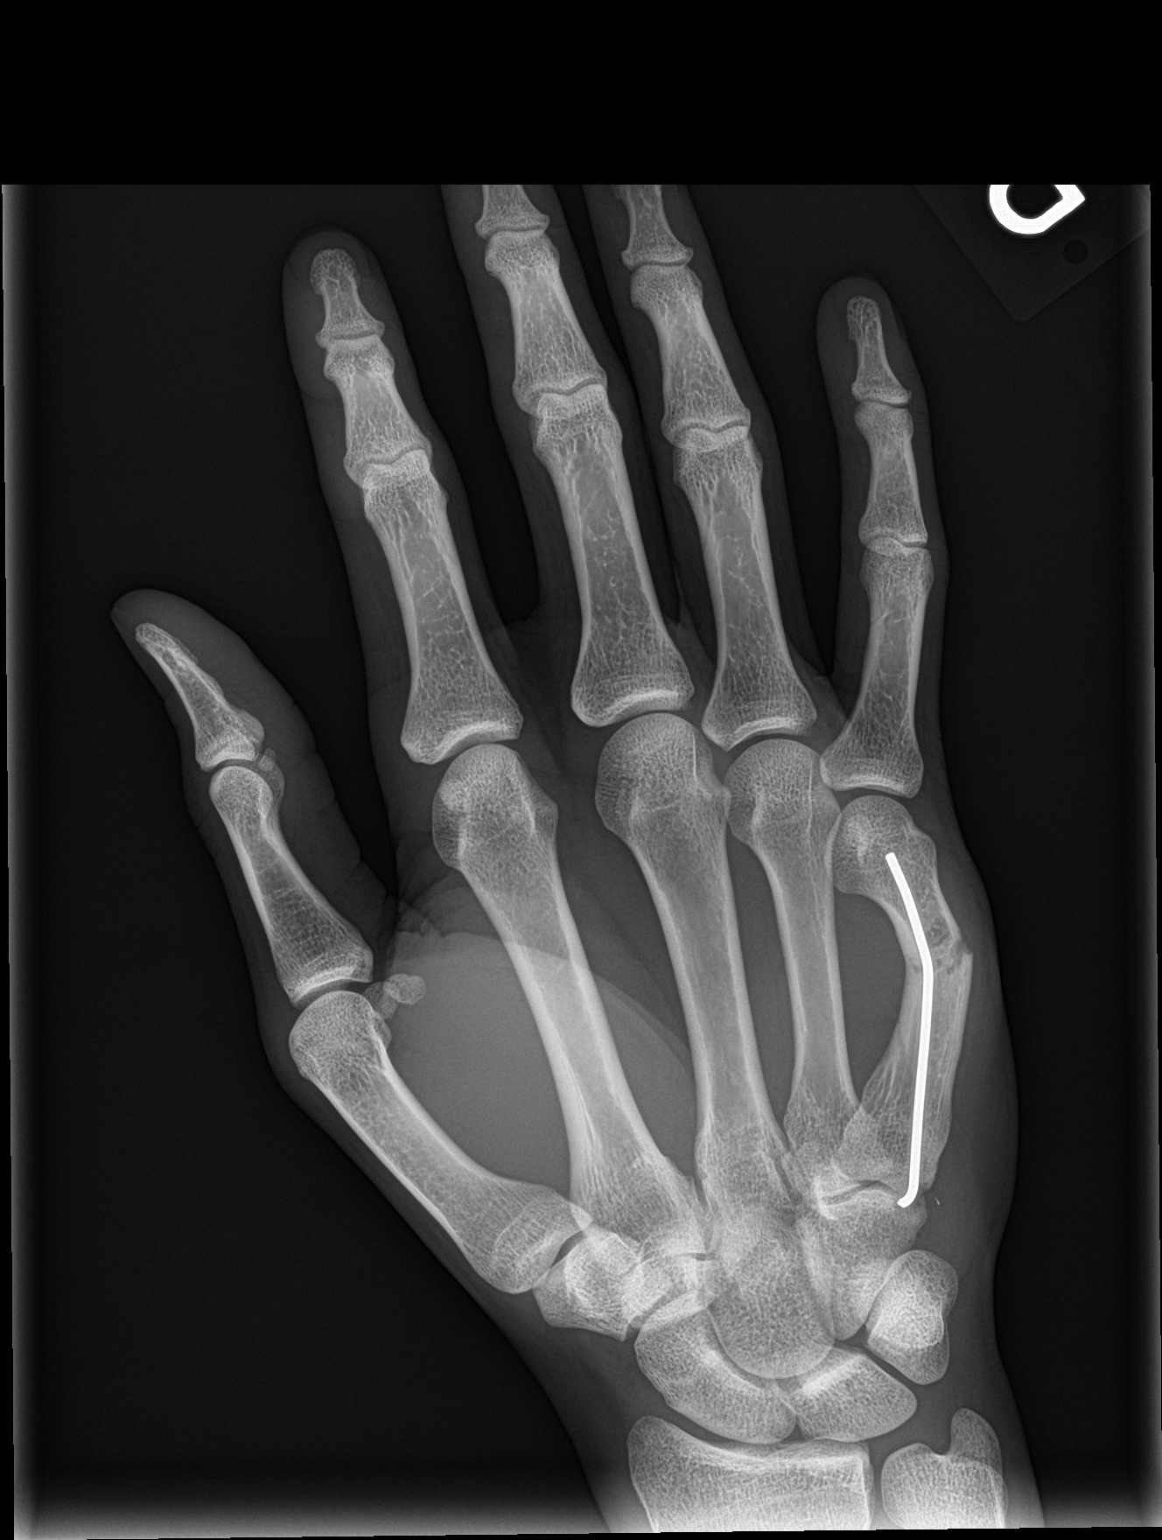

[hand obl]
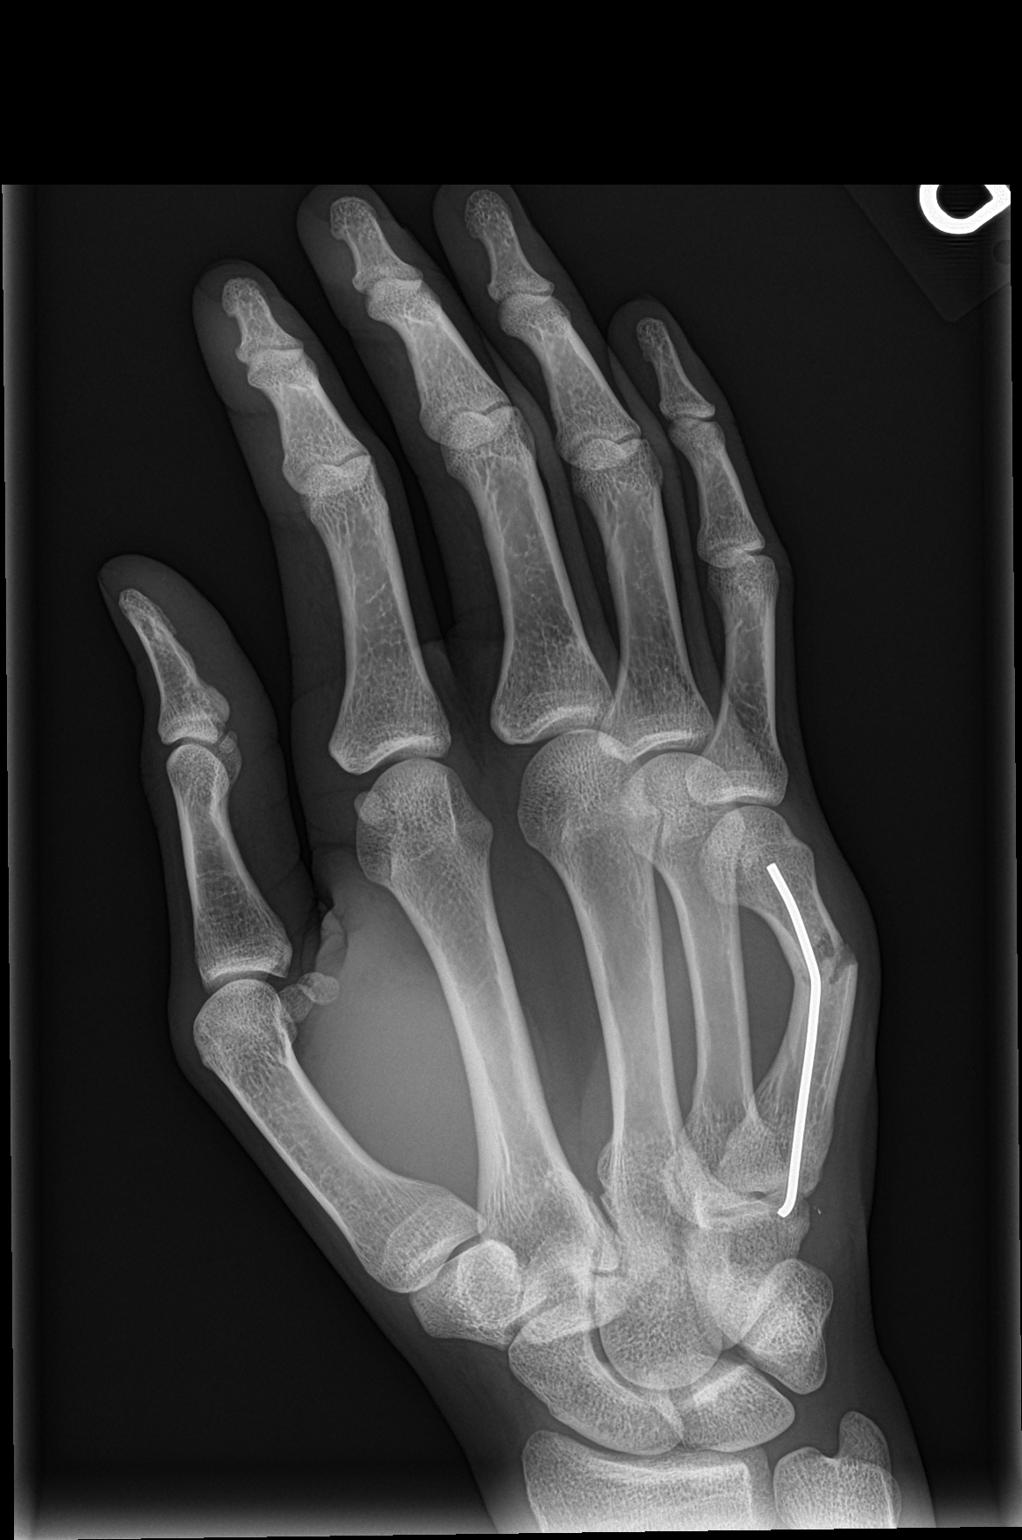

[hand lat]
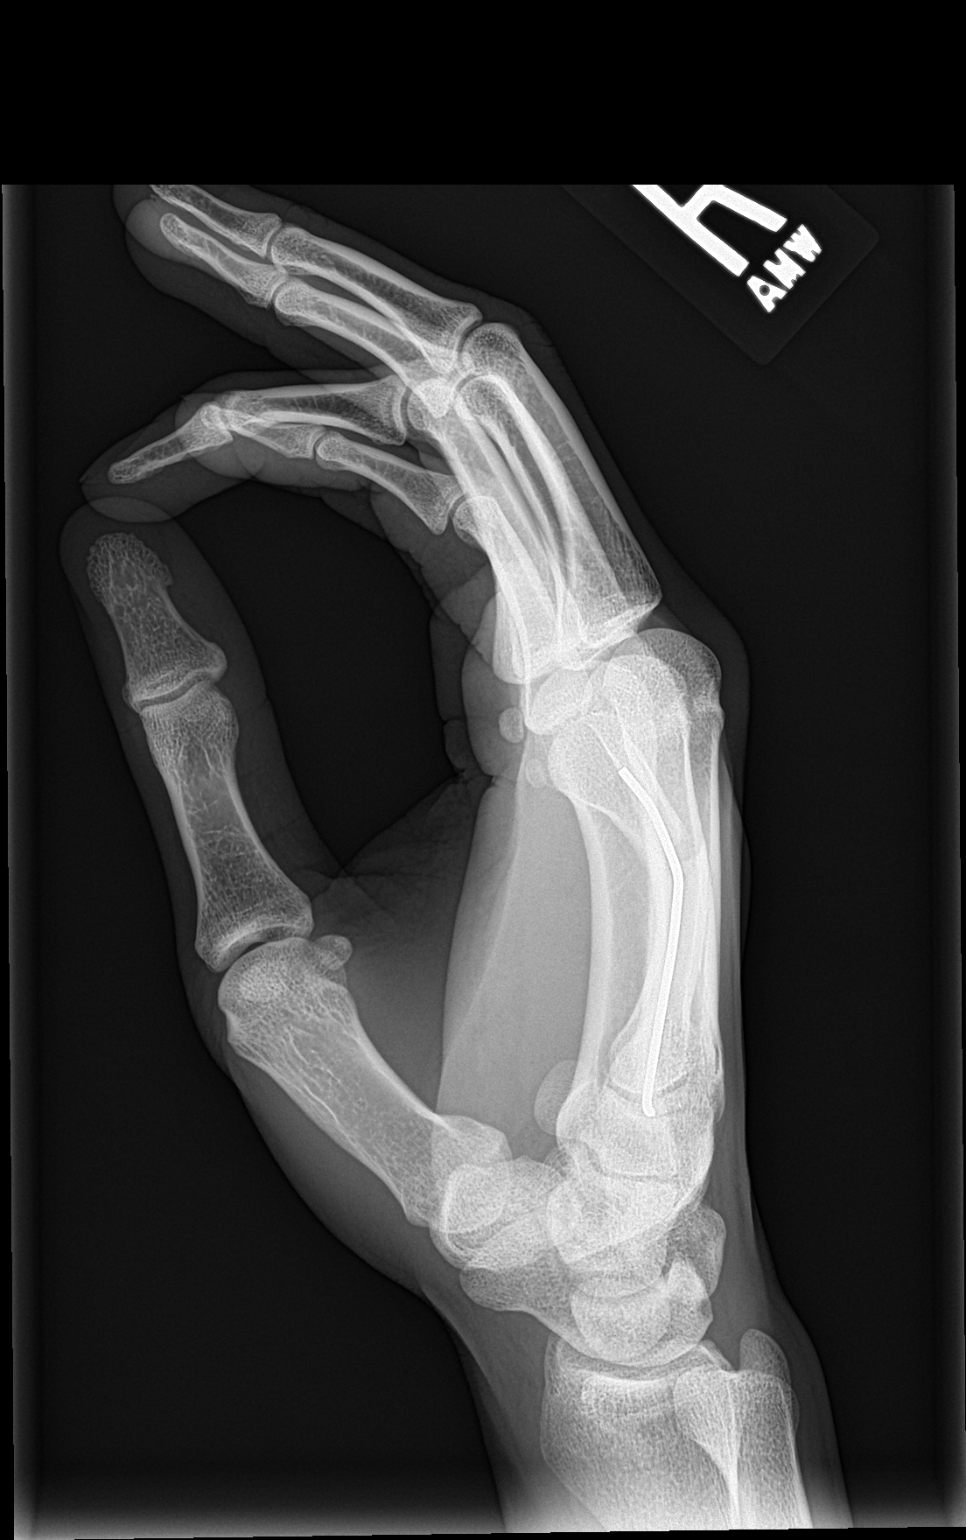

[3 of 3 positions shown; findings below may reference images not displayed]

FINDINGS: A K-wire extends along the long axis of the fifth metacarpal from
its base to the head neck junction. There is a fracture across the
midshaft in the same location as the old fracture. The K-wire is
angled anteriorly along with the fifth metacarpal approximately 22
degrees.

No other fractures.  The joints are normally spaced and aligned.
IMPRESSION: 1. Findings suggest a recurrent fracture through the shaft of the
fifth metacarpal, which is angulated anteriorly and bends the intra
medullary wire, which was placed for the ORIF of the previous
fracture to this portion of the fifth metacarpal. Alternatively,
this could be a chronic appearance for this fracture and the
operative fixation. There are no prior radiographs that included the
orthopedic hardware.
2. No other evidence of a fracture.  Joints are normally aligned.

## 2017-11-19 ENCOUNTER — Other Ambulatory Visit: Payer: Self-pay | Admitting: Infectious Diseases

## 2017-11-19 DIAGNOSIS — B20 Human immunodeficiency virus [HIV] disease: Secondary | ICD-10-CM

## 2018-01-18 ENCOUNTER — Other Ambulatory Visit: Payer: Self-pay

## 2018-01-18 DIAGNOSIS — B20 Human immunodeficiency virus [HIV] disease: Secondary | ICD-10-CM

## 2018-01-18 MED ORDER — ELVITEG-COBIC-EMTRICIT-TENOFAF 150-150-200-10 MG PO TABS: 1.0000 | ORAL_TABLET | Freq: Every day | ORAL | 0 refills | Status: DC

## 2018-02-15 ENCOUNTER — Other Ambulatory Visit: Payer: Self-pay | Admitting: Infectious Diseases

## 2018-02-15 DIAGNOSIS — B20 Human immunodeficiency virus [HIV] disease: Secondary | ICD-10-CM

## 2018-04-13 ENCOUNTER — Other Ambulatory Visit: Payer: Self-pay

## 2018-04-13 ENCOUNTER — Telehealth: Payer: Self-pay

## 2018-04-13 DIAGNOSIS — B20 Human immunodeficiency virus [HIV] disease: Secondary | ICD-10-CM

## 2018-04-13 NOTE — Telephone Encounter (Signed)
Patient called requesting refills on medication Genvoya. Pt has not been seen in office since 2018 stated that he moved back home to [place]. Patient stated that he feels more comfortable with the care in our office than the offices in [place]. Pt was informed that we would not be able to refill medication as requested due to him not being seen in our office and since he was not in the area anymore. Pt was also informed to reach out to the Health Department or his insurance to transfer care that is closer to home. Pt verbalized understanding and will transfer care to a new ID office in [place]. Will inform MD that pt is transferring care. Lorenso CourierJose L Maldonado, [place]

## 2018-04-16 ENCOUNTER — Other Ambulatory Visit: Payer: 59

## 2018-04-16 ENCOUNTER — Other Ambulatory Visit (HOSPITAL_COMMUNITY)
Admission: RE | Admit: 2018-04-16 | Discharge: 2018-04-16 | Disposition: A | Discharge status: Home / Self Care | Payer: 59 | Source: Ambulatory Visit | Attending: Infectious Diseases | Admitting: Infectious Diseases

## 2018-04-16 DIAGNOSIS — B20 Human immunodeficiency virus [HIV] disease: Secondary | ICD-10-CM

## 2018-04-16 DIAGNOSIS — A64 Unspecified sexually transmitted disease: Secondary | ICD-10-CM

## 2018-04-16 DIAGNOSIS — Z113 Encounter for screening for infections with a predominantly sexual mode of transmission: Secondary | ICD-10-CM | POA: Insufficient documentation

## 2018-04-16 DIAGNOSIS — Z79899 Other long term (current) drug therapy: Secondary | ICD-10-CM

## 2018-04-17 LAB — T-HELPER CELL (CD4) - (RCID CLINIC ONLY)
CD4 % Helper T Cell: 7 % — ABNORMAL LOW (ref 33–55)
CD4 T CELL ABS: 220 /uL — AB (ref 400–2700)

## 2018-04-17 LAB — URINE CYTOLOGY ANCILLARY ONLY
Chlamydia: NEGATIVE
NEISSERIA GONORRHEA: NEGATIVE

## 2018-04-18 LAB — COMPLETE METABOLIC PANEL WITH GFR
AG Ratio: 1.2 (calc) (ref 1.0–2.5)
ALBUMIN MSPROF: 4.5 g/dL (ref 3.6–5.1)
ALKALINE PHOSPHATASE (APISO): 95 U/L (ref 40–115)
ALT: 28 U/L (ref 9–46)
AST: 23 U/L (ref 10–40)
BILIRUBIN TOTAL: 0.4 mg/dL (ref 0.2–1.2)
BUN: 11 mg/dL (ref 7–25)
CO2: 31 mmol/L (ref 20–32)
CREATININE: 1.06 mg/dL (ref 0.60–1.35)
Calcium: 9.7 mg/dL (ref 8.6–10.3)
Chloride: 102 mmol/L (ref 98–110)
GFR, Est African American: 113 mL/min/{1.73_m2} (ref >=60)
GFR, Est Non African American: 98 mL/min/{1.73_m2} (ref >=60)
GLOBULIN: 3.7 g/dL (ref 1.9–3.7)
Glucose, Bld: 91 mg/dL (ref 65–99)
POTASSIUM: 4.6 mmol/L (ref 3.5–5.3)
SODIUM: 138 mmol/L (ref 135–146)
Total Protein: 8.2 g/dL — ABNORMAL HIGH (ref 6.1–8.1)

## 2018-04-18 LAB — CBC WITH DIFFERENTIAL/PLATELET
BASOS PCT: 0.5 %
Basophils Absolute: 31 cells/uL (ref 0–200)
EOS PCT: 1.8 %
Eosinophils Absolute: 110 cells/uL (ref 15–500)
HCT: 45.6 % (ref 38.5–50.0)
Hemoglobin: 15.1 g/dL (ref 13.2–17.1)
Lymphs Abs: 2983 cells/uL (ref 850–3900)
MCH: 28.7 pg (ref 27.0–33.0)
MCHC: 33.1 g/dL (ref 32.0–36.0)
MCV: 86.7 fL (ref 80.0–100.0)
MONOS PCT: 8.4 %
MPV: 10.3 fL (ref 7.5–12.5)
NEUTROS PCT: 40.4 %
Neutro Abs: 2464 cells/uL (ref 1500–7800)
PLATELETS: 231 10*3/uL (ref 140–400)
RBC: 5.26 10*6/uL (ref 4.20–5.80)
RDW: 12.6 % (ref 11.0–15.0)
TOTAL LYMPHOCYTE: 48.9 %
WBC: 6.1 10*3/uL (ref 3.8–10.8)
WBCMIX: 512 {cells}/uL (ref 200–950)

## 2018-04-18 LAB — LIPID PANEL
CHOL/HDL RATIO: 2.7 (calc) (ref <5.0)
CHOLESTEROL: 131 mg/dL (ref <200)
HDL: 48 mg/dL (ref >40)
LDL Cholesterol (Calc): 65 mg/dL (calc)
Non-HDL Cholesterol (Calc): 83 mg/dL (calc) (ref <130)
TRIGLYCERIDES: 92 mg/dL (ref <150)

## 2018-04-18 LAB — HIV-1 RNA QUANT-NO REFLEX-BLD
HIV 1 RNA QUANT: 80800 {copies}/mL — AB
HIV-1 RNA QUANT, LOG: 4.91 {Log_copies}/mL — AB

## 2018-04-18 LAB — RPR: RPR Ser Ql: NONREACTIVE

## 2018-05-02 ENCOUNTER — Telehealth: Payer: Self-pay | Admitting: *Deleted

## 2018-05-02 NOTE — Telephone Encounter (Signed)
Patient had questions about his lab results, if he was going to be restarted on his previous medication.  RN advised this would be addressed in his visit, as Dr Ninetta LightsHatcher would consider many aspects before choosing a medication regimen.  He verbalized understanding, agreement. Of note, patient said [place] moved to [place], will need help transferring once he is stable on therapy again. Andree CossHowell, Jacek Colson M, RN

## 2018-05-08 ENCOUNTER — Ambulatory Visit (INDEPENDENT_AMBULATORY_CARE_PROVIDER_SITE_OTHER): Payer: 59 | Admitting: Infectious Diseases

## 2018-05-08 ENCOUNTER — Encounter: Payer: 59 | Admitting: Infectious Diseases

## 2018-05-08 ENCOUNTER — Encounter: Payer: Self-pay | Admitting: Infectious Diseases

## 2018-05-08 VITALS — BP 128/85 | HR 57 | Temp 98.3°F | Wt 183.1 lb

## 2018-05-08 DIAGNOSIS — F325 Major depressive disorder, single episode, in full remission: Secondary | ICD-10-CM

## 2018-05-08 DIAGNOSIS — Z113 Encounter for screening for infections with a predominantly sexual mode of transmission: Secondary | ICD-10-CM

## 2018-05-08 DIAGNOSIS — K603 Anal fistula: Secondary | ICD-10-CM | POA: Diagnosis not present

## 2018-05-08 DIAGNOSIS — B20 Human immunodeficiency virus [HIV] disease: Secondary | ICD-10-CM

## 2018-05-08 DIAGNOSIS — Z79899 Other long term (current) drug therapy: Secondary | ICD-10-CM

## 2018-05-08 MED ORDER — ELVITEG-COBIC-EMTRICIT-TENOFAF 150-150-200-10 MG PO TABS: 1.0000 | ORAL_TABLET | Freq: Every day | ORAL | 3 refills | Status: DC

## 2018-05-08 NOTE — Assessment & Plan Note (Addendum)
Will restart him on genvoya He is in [place] now, will help him find new MD when he is ready.  Given condoms Flu and PCV 13 when available.  rtc in 6 weeks.

## 2018-05-08 NOTE — Progress Notes (Signed)
   Subjective:    Patient ID: Francisco Mitchell, male    DOB: 1996/04/24, 24 y.o.   MRN: [id number]  HPI 22 yo M with newly dx HIV+ on 07-16-15. Seen March 2017 and started on genvoya, bactrim.  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 Still healing but has no further pain.  He has been off art for several months. Had trouble with transportation so he was not able to make appt and get refills. Has insurance.  Recently finished school for business mgmt.   HIV 1 RNA Quant (copies/mL)  Date Value  04/16/2018 80,800 (H)  12/07/2016 93,200 (H)  06/27/2016 59 (H)   CD4 T Cell Abs (/uL)  Date Value  04/16/2018 220 (L)  12/07/2016 240 (L)  06/27/2016 160 (L)    Review of Systems  Constitutional: Negative for appetite change and unexpected weight change.  Respiratory: Negative for cough and shortness of breath.   Gastrointestinal: Negative for blood in stool and constipation.  Genitourinary: Negative for difficulty urinating.  Psychiatric/Behavioral: Negative for sleep disturbance.  Please see HPI. All other systems reviewed and negative.     Objective:   Physical Exam  Constitutional: He appears well-developed and well-nourished.  HENT:  Mouth/Throat: No oropharyngeal exudate.  Eyes: Pupils are equal, round, and reactive to light. EOM are normal.  Neck: Normal range of motion. Neck supple.  Cardiovascular: Normal rate, regular rhythm and normal heart sounds.  Pulmonary/Chest: Effort normal and breath sounds normal.  Abdominal: Soft. Bowel sounds are normal. There is no tenderness. There is no guarding.  Musculoskeletal: Normal range of motion. He exhibits no edema.  Lymphadenopathy:    He has no cervical adenopathy.  Psychiatric: He has a normal mood and affect.       Assessment & Plan:

## 2018-05-08 NOTE — Assessment & Plan Note (Signed)
Per pt has been healing well.  Will continue to follow.

## 2018-05-08 NOTE — Assessment & Plan Note (Signed)
States his mood has been good.  Will continue to watch.

## 2018-07-09 ENCOUNTER — Ambulatory Visit: Payer: 59 | Admitting: Infectious Diseases

## 2018-07-09 ENCOUNTER — Ambulatory Visit: Payer: 59 | Admitting: Family

## 2018-12-25 ENCOUNTER — Telehealth: Payer: Self-pay

## 2018-12-25 NOTE — Telephone Encounter (Signed)
Called patient to schedule appointment to continue care, has history of no shows. Left message to give office a call back.     Francisco Mitchell, [place]

## 2019-05-15 ENCOUNTER — Other Ambulatory Visit: Payer: Self-pay | Admitting: Infectious Diseases

## 2019-05-15 DIAGNOSIS — B20 Human immunodeficiency virus [HIV] disease: Secondary | ICD-10-CM

## 2019-05-28 ENCOUNTER — Encounter: Payer: Self-pay | Admitting: Infectious Diseases

## 2019-05-28 ENCOUNTER — Other Ambulatory Visit: Payer: Self-pay

## 2019-05-28 ENCOUNTER — Ambulatory Visit (INDEPENDENT_AMBULATORY_CARE_PROVIDER_SITE_OTHER): Payer: 59 | Admitting: Infectious Diseases

## 2019-05-28 VITALS — BP 131/85 | HR 89 | Temp 98.1°F

## 2019-05-28 DIAGNOSIS — B20 Human immunodeficiency virus [HIV] disease: Secondary | ICD-10-CM

## 2019-05-28 DIAGNOSIS — K603 Anal fistula, unspecified: Secondary | ICD-10-CM

## 2019-05-28 DIAGNOSIS — Z113 Encounter for screening for infections with a predominantly sexual mode of transmission: Secondary | ICD-10-CM | POA: Diagnosis not present

## 2019-05-28 DIAGNOSIS — Z23 Encounter for immunization: Secondary | ICD-10-CM | POA: Diagnosis not present

## 2019-05-28 DIAGNOSIS — Z79899 Other long term (current) drug therapy: Secondary | ICD-10-CM

## 2019-05-28 DIAGNOSIS — F325 Major depressive disorder, single episode, in full remission: Secondary | ICD-10-CM | POA: Diagnosis not present

## 2019-05-28 MED ORDER — GENVOYA 150-150-200-10 MG PO TABS: ORAL_TABLET | ORAL | 4 refills | Status: DC

## 2019-05-28 NOTE — Assessment & Plan Note (Signed)
Today states his mood is good. Sleeping well.   Will f/u as needed.

## 2019-05-28 NOTE — Addendum Note (Signed)
Addended by: Lenore Cordia on: 05/28/2019 04:39 PM   Modules accepted: Orders

## 2019-05-28 NOTE — Progress Notes (Signed)
   Subjective:    Patient ID: Francisco Mitchell, male    DOB: 04-08-1996, 23 y.o.   MRN: [id number]  HPI 23yo M with newly dx HIV+ on 07-16-15. Seen March 2017 and started on genvoya, bactrim.  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 At his last f/u 04-2018 he was off art and was sched for 6 week f/u.  Has moved to [place]. Looking for work.  Worried aobut rolling off his [employer].  No problems with genvoya. Needs refills  Prev anal fistula surgery has been doing well except for recent abscess. This was drained and he has since been doing well.   HIV 1 RNA Quant (copies/mL)  Date Value  04/16/2018 80,800 (H)  12/07/2016 93,200 (H)  06/27/2016 59 (H)   CD4 T Cell Abs (/uL)  Date Value  04/16/2018 220 (L)  12/07/2016 240 (L)  06/27/2016 160 (L)     Review of Systems  Constitutional: Negative for appetite change, chills, fatigue, fever and unexpected weight change.  Respiratory: Negative for cough and shortness of breath.   Gastrointestinal: Negative for constipation and diarrhea.  Genitourinary: Negative for difficulty urinating.  Psychiatric/Behavioral: Negative for dysphoric mood and sleep disturbance.  Please see HPI. All other systems reviewed and negative.      Objective:   Physical Exam Constitutional:      Appearance: Normal appearance.  HENT:     Mouth/Throat:     Mouth: Mucous membranes are moist.     Pharynx: No oropharyngeal exudate.  Eyes:     Extraocular Movements: Extraocular movements intact.     Pupils: Pupils are equal, round, and reactive to light.  Cardiovascular:     Rate and Rhythm: Normal rate and regular rhythm.  Pulmonary:     Effort: Pulmonary effort is normal.     Breath sounds: Normal breath sounds.  Abdominal:     General: Bowel sounds are normal. There is no distension.     Palpations: Abdomen is soft.     Tenderness: There is no abdominal tenderness.  Musculoskeletal:     Right lower leg: No edema.      Left lower leg: No edema.  Neurological:     Mental Status: He is alert.  Psychiatric:        Mood and Affect: Mood normal.        Behavior: Behavior normal.           Assessment & Plan:

## 2019-05-28 NOTE — Assessment & Plan Note (Signed)
He is doing well Will check his labs today Refill his rx Given condoms Flu vax pcv 13 rtc in 6 months

## 2019-05-28 NOTE — Assessment & Plan Note (Signed)
He believes this is stable, healing since last surgery.  Will f/u as needed.

## 2019-06-04 LAB — COMPREHENSIVE METABOLIC PANEL
AG Ratio: 1.4 (calc) (ref 1.0–2.5)
ALT: 18 U/L (ref 9–46)
AST: 17 U/L (ref 10–40)
Albumin: 4.1 g/dL (ref 3.6–5.1)
Alkaline phosphatase (APISO): 88 U/L (ref 36–130)
BUN: 7 mg/dL (ref 7–25)
CO2: 28 mmol/L (ref 20–32)
Calcium: 9.6 mg/dL (ref 8.6–10.3)
Chloride: 103 mmol/L (ref 98–110)
Creat: 1.03 mg/dL (ref 0.60–1.35)
Globulin: 3 g/dL (calc) (ref 1.9–3.7)
Glucose, Bld: 67 mg/dL (ref 65–99)
Potassium: 3.8 mmol/L (ref 3.5–5.3)
Sodium: 139 mmol/L (ref 135–146)
Total Bilirubin: 0.5 mg/dL (ref 0.2–1.2)
Total Protein: 7.1 g/dL (ref 6.1–8.1)

## 2019-06-04 LAB — CBC
HCT: 40.9 % (ref 38.5–50.0)
Hemoglobin: 13.7 g/dL (ref 13.2–17.1)
MCH: 29.8 pg (ref 27.0–33.0)
MCHC: 33.5 g/dL (ref 32.0–36.0)
MCV: 88.9 fL (ref 80.0–100.0)
MPV: 10.2 fL (ref 7.5–12.5)
Platelets: 265 10*3/uL (ref 140–400)
RBC: 4.6 10*6/uL (ref 4.20–5.80)
RDW: 13 % (ref 11.0–15.0)
WBC: 8.7 10*3/uL (ref 3.8–10.8)

## 2019-06-04 LAB — HIV-1 RNA ULTRAQUANT REFLEX TO GENTYP+
HIV 1 RNA Quant: <20 copies/mL
HIV-1 RNA Quant, Log: <1.3 Log copies/mL

## 2019-06-04 LAB — RPR: RPR Ser Ql: NONREACTIVE

## 2019-06-23 ENCOUNTER — Other Ambulatory Visit: Payer: Self-pay | Admitting: Infectious Diseases

## 2019-06-23 DIAGNOSIS — B20 Human immunodeficiency virus [HIV] disease: Secondary | ICD-10-CM

## 2019-11-26 ENCOUNTER — Ambulatory Visit: Payer: 59 | Admitting: Infectious Diseases

## 2019-11-26 ENCOUNTER — Telehealth: Payer: Self-pay | Admitting: Pharmacy Technician

## 2019-11-26 ENCOUNTER — Telehealth: Payer: Self-pay

## 2019-11-26 NOTE — Telephone Encounter (Signed)
Patient is approved for [employer] Advancing Access 30-day coverage. I have communicated this to him and the [facility] [place]. He will connect with Roe Coombs tomorrow for adap application.

## 2019-11-26 NOTE — Telephone Encounter (Signed)
[facility] Patient Advocate Encounter   Patient has been approved for [employer] Advancing Access Patient Assistance Program for [place] for 30-days coverage. This assistance will make the patient's copay $0.  I have spoken with the patient and they will pick up at [facility] in [place].  The billing information is  Member ID: [id number] RxBin: [id number] PCN: [id number] Group: [id number]    Patient knows to call the office with questions or concerns. He is also applying for adap.  Beulah Gandy, CPhT Specialty Pharmacy Patient [facility] Phone: [phone] Fax: [phone] 11/26/2019 10:37 AM

## 2019-11-26 NOTE — Telephone Encounter (Signed)
Received call from patient stating that he no longer has insurance and needs to sign up for ADAP. Patient scheduled for evisit with financial counselor 3/17. Patient took last pill of Genvoya yesterday and is requesting assistance with refills and costs. Forwarding request to [place] in pharmacy for assistance in emergency coverage.   Francisco Musa Loyola Mast, RN

## 2019-11-27 ENCOUNTER — Ambulatory Visit: Payer: Self-pay

## 2019-11-27 ENCOUNTER — Other Ambulatory Visit: Payer: Self-pay

## 2019-12-24 ENCOUNTER — Ambulatory Visit: Payer: Self-pay | Admitting: Infectious Diseases

## 2020-04-12 ENCOUNTER — Other Ambulatory Visit: Payer: Self-pay | Admitting: Infectious Diseases

## 2020-04-12 DIAGNOSIS — B20 Human immunodeficiency virus [HIV] disease: Secondary | ICD-10-CM

## 2020-05-16 ENCOUNTER — Other Ambulatory Visit: Payer: Self-pay | Admitting: Infectious Diseases

## 2020-05-16 DIAGNOSIS — B20 Human immunodeficiency virus [HIV] disease: Secondary | ICD-10-CM

## 2020-06-01 ENCOUNTER — Other Ambulatory Visit: Payer: Self-pay | Admitting: Infectious Diseases

## 2020-06-01 ENCOUNTER — Other Ambulatory Visit: Payer: Self-pay

## 2020-06-01 DIAGNOSIS — B20 Human immunodeficiency virus [HIV] disease: Secondary | ICD-10-CM

## 2020-06-08 ENCOUNTER — Other Ambulatory Visit: Payer: Self-pay | Admitting: *Deleted

## 2020-06-08 DIAGNOSIS — Z113 Encounter for screening for infections with a predominantly sexual mode of transmission: Secondary | ICD-10-CM

## 2020-06-08 DIAGNOSIS — B20 Human immunodeficiency virus [HIV] disease: Secondary | ICD-10-CM

## 2020-06-08 DIAGNOSIS — Z79899 Other long term (current) drug therapy: Secondary | ICD-10-CM

## 2020-06-09 ENCOUNTER — Other Ambulatory Visit: Payer: 59

## 2020-06-09 ENCOUNTER — Other Ambulatory Visit: Payer: Self-pay

## 2020-06-09 ENCOUNTER — Other Ambulatory Visit (HOSPITAL_COMMUNITY)
Admission: RE | Admit: 2020-06-09 | Discharge: 2020-06-09 | Disposition: A | Discharge status: Home / Self Care | Payer: 59 | Source: Ambulatory Visit | Attending: Infectious Diseases | Admitting: Infectious Diseases

## 2020-06-09 DIAGNOSIS — B20 Human immunodeficiency virus [HIV] disease: Secondary | ICD-10-CM

## 2020-06-09 DIAGNOSIS — Z113 Encounter for screening for infections with a predominantly sexual mode of transmission: Secondary | ICD-10-CM

## 2020-06-09 DIAGNOSIS — Z79899 Other long term (current) drug therapy: Secondary | ICD-10-CM

## 2020-06-10 LAB — URINE CYTOLOGY ANCILLARY ONLY
Chlamydia: NEGATIVE
Comment: NEGATIVE
Comment: NORMAL
Neisseria Gonorrhea: NEGATIVE

## 2020-06-10 LAB — T-HELPER CELL (CD4) - (RCID CLINIC ONLY)
CD4 % Helper T Cell: 24 % — ABNORMAL LOW (ref 33–65)
CD4 T Cell Abs: 419 /uL (ref 400–1790)

## 2020-06-12 LAB — COMPLETE METABOLIC PANEL WITH GFR
AG Ratio: 1.9 (calc) (ref 1.0–2.5)
ALT: 21 U/L (ref 9–46)
AST: 17 U/L (ref 10–40)
Albumin: 4.7 g/dL (ref 3.6–5.1)
Alkaline phosphatase (APISO): 74 U/L (ref 36–130)
BUN/Creatinine Ratio: 11 (calc) (ref 6–22)
BUN: 15 mg/dL (ref 7–25)
CO2: 28 mmol/L (ref 20–32)
Calcium: 9.8 mg/dL (ref 8.6–10.3)
Chloride: 103 mmol/L (ref 98–110)
Creat: 1.38 mg/dL — ABNORMAL HIGH (ref 0.60–1.35)
GFR, Est African American: 81 mL/min/{1.73_m2} (ref >=60)
GFR, Est Non African American: 70 mL/min/{1.73_m2} (ref >=60)
Globulin: 2.5 g/dL (calc) (ref 1.9–3.7)
Glucose, Bld: 99 mg/dL (ref 65–99)
Potassium: 5 mmol/L (ref 3.5–5.3)
Sodium: 138 mmol/L (ref 135–146)
Total Bilirubin: 0.4 mg/dL (ref 0.2–1.2)
Total Protein: 7.2 g/dL (ref 6.1–8.1)

## 2020-06-12 LAB — CBC WITH DIFFERENTIAL/PLATELET
Absolute Monocytes: 670 cells/uL (ref 200–950)
Basophils Absolute: 39 cells/uL (ref 0–200)
Basophils Relative: 0.6 %
Eosinophils Absolute: 150 cells/uL (ref 15–500)
Eosinophils Relative: 2.3 %
HCT: 45.2 % (ref 38.5–50.0)
Hemoglobin: 15.3 g/dL (ref 13.2–17.1)
Lymphs Abs: 1866 cells/uL (ref 850–3900)
MCH: 31.8 pg (ref 27.0–33.0)
MCHC: 33.8 g/dL (ref 32.0–36.0)
MCV: 94 fL (ref 80.0–100.0)
MPV: 9.8 fL (ref 7.5–12.5)
Monocytes Relative: 10.3 %
Neutro Abs: 3777 cells/uL (ref 1500–7800)
Neutrophils Relative %: 58.1 %
Platelets: 246 10*3/uL (ref 140–400)
RBC: 4.81 10*6/uL (ref 4.20–5.80)
RDW: 11.8 % (ref 11.0–15.0)
Total Lymphocyte: 28.7 %
WBC: 6.5 10*3/uL (ref 3.8–10.8)

## 2020-06-12 LAB — HIV-1 RNA QUANT-NO REFLEX-BLD
HIV 1 RNA Quant: <20 Copies/mL
HIV-1 RNA Quant, Log: <1.3 Log cps/mL

## 2020-06-12 LAB — LIPID PANEL
Cholesterol: 147 mg/dL (ref <200)
HDL: 56 mg/dL (ref >=40)
LDL Cholesterol (Calc): 78 mg/dL (calc)
Non-HDL Cholesterol (Calc): 91 mg/dL (calc) (ref <130)
Total CHOL/HDL Ratio: 2.6 (calc) (ref <5.0)
Triglycerides: 47 mg/dL (ref <150)

## 2020-06-12 LAB — RPR: RPR Ser Ql: NONREACTIVE

## 2020-06-23 ENCOUNTER — Ambulatory Visit: Payer: Self-pay | Admitting: Infectious Diseases

## 2020-07-23 ENCOUNTER — Other Ambulatory Visit: Payer: Self-pay | Admitting: *Deleted

## 2020-07-23 ENCOUNTER — Other Ambulatory Visit: Payer: Self-pay | Admitting: Infectious Diseases

## 2020-07-23 DIAGNOSIS — B20 Human immunodeficiency virus [HIV] disease: Secondary | ICD-10-CM

## 2020-07-28 ENCOUNTER — Ambulatory Visit (INDEPENDENT_AMBULATORY_CARE_PROVIDER_SITE_OTHER): Payer: 59 | Admitting: Infectious Diseases

## 2020-07-28 ENCOUNTER — Encounter: Payer: Self-pay | Admitting: Infectious Diseases

## 2020-07-28 ENCOUNTER — Other Ambulatory Visit: Payer: Self-pay

## 2020-07-28 VITALS — BP 118/79 | HR 98 | Temp 98.3°F | Wt 178.0 lb

## 2020-07-28 DIAGNOSIS — K603 Anal fistula: Secondary | ICD-10-CM

## 2020-07-28 DIAGNOSIS — Z23 Encounter for immunization: Secondary | ICD-10-CM | POA: Diagnosis not present

## 2020-07-28 DIAGNOSIS — R369 Urethral discharge, unspecified: Secondary | ICD-10-CM | POA: Diagnosis not present

## 2020-07-28 DIAGNOSIS — B20 Human immunodeficiency virus [HIV] disease: Secondary | ICD-10-CM | POA: Diagnosis not present

## 2020-07-28 DIAGNOSIS — Z113 Encounter for screening for infections with a predominantly sexual mode of transmission: Secondary | ICD-10-CM

## 2020-07-28 DIAGNOSIS — Z79899 Other long term (current) drug therapy: Secondary | ICD-10-CM

## 2020-07-28 MED ORDER — MOXIFLOXACIN HCL 400 MG PO TABS: 400.0000 mg | ORAL_TABLET | Freq: Every day | ORAL | 0 refills | Status: DC

## 2020-07-28 MED ORDER — DOXYCYCLINE HYCLATE 100 MG PO TABS: 100.0000 mg | ORAL_TABLET | Freq: Two times a day (BID) | ORAL | 0 refills | Status: DC

## 2020-07-28 MED ORDER — GENVOYA 150-150-200-10 MG PO TABS: ORAL_TABLET | ORAL | 4 refills | Status: DC

## 2020-07-28 NOTE — Addendum Note (Signed)
Addended by: Lorenso Courier on: 07/28/2020 04:45 PM   Modules accepted: Orders

## 2020-07-28 NOTE — Assessment & Plan Note (Signed)
He will continue f/u in [place]

## 2020-07-28 NOTE — Assessment & Plan Note (Signed)
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 Will give him doxy --> moxi

## 2020-07-28 NOTE — Assessment & Plan Note (Signed)
He is doing well Given condoms Will give him flu, PCV23, mening today Will need to get COVID booster separately rx refilled.  rtc in 1 year.

## 2020-07-28 NOTE — Progress Notes (Signed)
   Subjective:    Patient ID: Francisco Mitchell, male    DOB: [birth year], 24 y.o.   MRN: [id number]  HPI 24yo M with newly dx HIV+ on 07-16-15. Seen March 2017 and started on genvoya, bactrim.  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 Had repeat abscess 2020.  Has not had further issues, states it is almost finished healing.  No probs with ART.  He has gotten COVID vax. For flu vax today.   HIV 1 RNA Quant  Date Value  06/09/2020 <20 Copies/mL  05/28/2019 <20 NOT DETECTED copies/mL  04/16/2018 80,800 copies/mL (H)   CD4 T Cell Abs (/uL)  Date Value  06/09/2020 419  04/16/2018 220 (L)  12/07/2016 240 (L)      Review of Systems  Constitutional: Negative for appetite change and unexpected weight change.  Respiratory: Negative for cough and shortness of breath.   Gastrointestinal: Negative for constipation and diarrhea.  Genitourinary: Negative for difficulty urinating and hematuria.  Psychiatric/Behavioral: Negative for sleep disturbance.  Please see HPI. All other systems reviewed and negative.      Objective:   Physical Exam Vitals reviewed.  Constitutional:      Appearance: Normal appearance.  HENT:     Mouth/Throat:     Mouth: Mucous membranes are moist.     Pharynx: No oropharyngeal exudate.  Eyes:     Extraocular Movements: Extraocular movements intact.     Pupils: Pupils are equal, round, and reactive to light.  Cardiovascular:     Rate and Rhythm: Normal rate and regular rhythm.  Pulmonary:     Effort: Pulmonary effort is normal.     Breath sounds: Normal breath sounds.  Abdominal:     General: Bowel sounds are normal. There is no distension.     Palpations: Abdomen is soft.     Tenderness: There is no abdominal tenderness.  Musculoskeletal:        General: Normal range of motion.     Cervical back: Normal range of motion and neck supple.     Right lower leg: No edema.     Left lower leg: No edema.  Neurological:      General: No focal deficit present.     Mental Status: He is alert.  Psychiatric:        Mood and Affect: Mood normal.           Assessment & Plan:

## 2020-09-01 ENCOUNTER — Other Ambulatory Visit: Payer: Self-pay

## 2020-09-01 ENCOUNTER — Other Ambulatory Visit: Payer: Self-pay | Admitting: Infectious Diseases

## 2020-09-01 DIAGNOSIS — B20 Human immunodeficiency virus [HIV] disease: Secondary | ICD-10-CM

## 2021-04-27 ENCOUNTER — Other Ambulatory Visit (HOSPITAL_COMMUNITY): Payer: Self-pay

## 2021-04-28 ENCOUNTER — Ambulatory Visit: Payer: 59

## 2021-04-28 ENCOUNTER — Other Ambulatory Visit: Payer: Self-pay

## 2021-05-06 ENCOUNTER — Other Ambulatory Visit (HOSPITAL_COMMUNITY): Payer: Self-pay

## 2021-05-06 ENCOUNTER — Encounter: Payer: Self-pay | Admitting: Infectious Diseases

## 2021-05-07 ENCOUNTER — Ambulatory Visit: Payer: Self-pay | Admitting: Pharmacist

## 2021-06-22 ENCOUNTER — Encounter: Payer: Self-pay | Admitting: Physician Assistant

## 2021-06-22 ENCOUNTER — Ambulatory Visit: Payer: Self-pay | Admitting: Pharmacist

## 2021-06-22 ENCOUNTER — Other Ambulatory Visit: Payer: Self-pay

## 2021-06-22 ENCOUNTER — Ambulatory Visit (INDEPENDENT_AMBULATORY_CARE_PROVIDER_SITE_OTHER): Payer: Self-pay | Admitting: Physician Assistant

## 2021-06-22 VITALS — BP 128/73 | HR 69 | Temp 98.4°F | Wt 181.0 lb

## 2021-06-22 DIAGNOSIS — B20 Human immunodeficiency virus [HIV] disease: Secondary | ICD-10-CM

## 2021-06-22 DIAGNOSIS — Z79899 Other long term (current) drug therapy: Secondary | ICD-10-CM

## 2021-06-22 DIAGNOSIS — Z202 Contact with and (suspected) exposure to infections with a predominantly sexual mode of transmission: Secondary | ICD-10-CM

## 2021-06-22 DIAGNOSIS — Z113 Encounter for screening for infections with a predominantly sexual mode of transmission: Secondary | ICD-10-CM

## 2021-06-22 MED ORDER — CEFTRIAXONE SODIUM 500 MG IJ SOLR
500.0000 mg | Freq: Once | INTRAMUSCULAR | Status: AC
  Administered 2021-06-22: 500 mg via INTRAMUSCULAR

## 2021-06-22 MED ORDER — DOXYCYCLINE HYCLATE 100 MG PO TABS: 100.0000 mg | ORAL_TABLET | Freq: Two times a day (BID) | ORAL | 0 refills | Status: DC

## 2021-06-22 NOTE — Addendum Note (Signed)
Addended by: Valarie Cones on: 06/22/2021 03:07 PM   Modules accepted: Orders

## 2021-06-22 NOTE — Patient Instructions (Addendum)
Hello Francisco Mitchell! Nice to meet you and I hope you have safe travels back to [place] this afternoon.  Eat a cheese steak for me from [employer] :) We are drawing your labs scheduled for 11/16, follow up with Dr. Ninetta Lights on 11/29 at 2 pm as scheduled Doxycycline 100 mg twice daily x 7 days for chlamydia Rocephin 500 mg IM given today to cover against gonorrhea Avoid all sexual activity for 2 weeks following treatment I encourage you to wear condoms to protect against future sexually transmitted infections Continue taking Genvoya as instructed Chlamydia, Male Chlamydia is an STI (sexually transmitted infection) that is caused by bacteria. This infection spreads through sexual contact. Chlamydia can occur in different areas of the body, including: The urethra. This is the part of the body that drains urine from the bladder and through the penis. The throat. The rectum. This condition is not difficult to treat. However, if left untreated, chlamydia can lead to more serious health problems. What are the causes? This condition is caused by the bacteria Chlamydia trachomatis. The bacteria are spread from an infected partner during sexual activity. Chlamydia can spread through contact with the genitals, mouth, or rectum. What increases the risk? The following factors may make you more likely to develop this condition: Not using a condom correctly or not using a condom every time you have sex. Having a new sex partner or having more than one sex partner. Being a man who has sex with men (MSM). What are the signs or symptoms? In some cases, there are no symptoms, especially early in the infection. Having no symptoms is also called being asymptomatic. If symptoms develop, they may include: Urinating often, or having a burning feeling during urination. Pain or swelling in the testicles. Watery, mucus-like discharge from the penis. Redness, soreness, swelling, bleeding, or discharge from the rectum. This may  occur if the infection was spread through anal sex. Pain in the abdomen. Itching, burning, or redness in the eyes, or discharge from the eyes. Pain during sex. How is this diagnosed? This condition may be diagnosed with: Urine tests. Swab tests. Depending on your symptoms, your health care provider may use a cotton swab to collect discharge from your urethra or rectum to test for the bacteria. How is this treated? This condition is treated with antibiotic medicines. Follow these instructions at home: Sexual activity Tell your sex partner or partners about your infection. These include any partners for oral, anal, or vaginal sex that you have had within 60 days of when your symptoms started. Sex partners should also be treated, even if they have no signs of the disease. Do not have sex until you and your sex partners have completed treatment and your health care provider says it is okay. If your health care provider prescribed you a single-dose medicine as treatment, wait at least 7 days after taking the medicine before having sex. General instructions Take over-the-counter and prescription medicines as told by your health care provider. Finish all antibiotic medicine even when you start to feel better. It is up to you to get your test results. Ask your health care provider, or the department that is doing the test, when your results will be ready. Get plenty of rest. Eat a healthy, well-balanced diet. Drink enough fluids to keep your urine pale yellow. Keep all follow-up visits as told by your health care provider. This is important. You may need to be tested for infection again 3 months after treatment. How is this prevented?  The only sure way to prevent chlamydia is to avoid having vaginal, anal, and oral sex. However, you can lower your risk by: Using latex condoms correctly every time you have sex. Not having multiple sexual partners. Asking if your sex partner has been tested for STIs  and had negative results. Getting regular health screenings to check for STIs. Contact a health care provider if: You develop new symptoms or your symptoms do not get better after you complete treatment. You have a fever or chills. You have pain during sex. You develop new joint pain or swelling near your joints. You have pain or soreness in your testicles. Get help right away if: Your pain gets worse and does not get better with medicine. You have abnormal discharge. You develop flu-like symptoms, such as night sweats, sore throat, or muscle aches. Summary Chlamydia is an STI (sexually transmitted infection) that is caused by bacteria. This infection spreads through sexual contact. This condition is not difficult to treat. However, if left untreated, it can lead to more serious health problems. Some people have no symptoms (are asymptomatic), especially early in the infection. This condition is treated with antibiotic medicines. Using latex condoms correctly every time you have sex can help prevent chlamydia. This information is not intended to replace advice given to you by your health care provider. Make sure you discuss any questions you have with your health care provider. Document Revised: 09/16/2019 Document Reviewed: 09/16/2019 Elsevier Patient Education  2022 [employer].

## 2021-06-22 NOTE — Progress Notes (Signed)
Subjective:    Patient ID: Francisco Mitchell, male    DOB: 12-09-1995, 25 y.o.   MRN: [id number]  Chief Complaint  Patient presents with   Exposure to STD     HPI:  Francisco Mitchell is a 25 y.o. male with well controlled HIV-1.  He presents today for a confirmed exposure to chlamydia.  He was notified by male whom he had recently engaged in vaginal intercourse that was not anonymous.  He is currently asymptomatic.  He has had no other partners since exposure to chlamydia. He is bisexual.  He has been living in [place] and drove down this morning.  Insurance is ADAP, will follow up with Dr. Ninetta Lights 08/10/2021 for HIV care. He has been taking Genvoya as directed without any missed doses. Tolerating well without any adverse events. Last VL 06/09/20  undetectable Last CD4 06/09/20 419     Allergies  Allergen Reactions   Contrast Media [Iodinated Diagnostic Agents] Rash      Outpatient Medications Prior to Visit  Medication Sig Dispense Refill   doxycycline (VIBRA-TABS) 100 MG tablet Take 1 tablet (100 mg total) by mouth 2 (two) times daily. (Patient not taking: Reported on 06/22/2021) 14 tablet 0   GENVOYA 150-150-200-10 MG TABS tablet TAKE 1 TABLET BY MOUTH EVERY DAY WITH BREAKFAST 30 tablet 6   moxifloxacin (AVELOX) 400 MG tablet Take 1 tablet (400 mg total) by mouth daily at 8 pm. (Patient not taking: Reported on 06/22/2021) 7 tablet 0   No facility-administered medications prior to visit.     Past Medical History:  Diagnosis Date   AIDS (acquired immunodeficiency syndrome), CD4 <=200 (HCC)    Anal pain    Fracture of fifth metacarpal bone of right hand    reinjury 12-19-2015  s/p  revision ORIF 12-24-2015--  currently has half cast   Right hand pain      Past Surgical History:  Procedure Laterality Date   EVALUATION UNDER ANESTHESIA WITH ANAL FISTULECTOMY N/A 12/31/2015   Procedure: ANAL EXAM UNDER ANESTHESIA WITH FISTULOTOMY;  Surgeon: [name], MD;   Location: [facility];  Service: General;  Laterality: N/A;   HARDWARE REVISION Right 12/24/2015   Procedure: HARDWARE REVISION;  Surgeon: Dominica Severin, MD;  Location: [facility] OR;  Service: Orthopedics;  Laterality: Right;   INGUINAL HERNIA REPAIR  as child   OPEN REDUCTION INTERNAL FIXATION (ORIF) METACARPAL Right 12/24/2015   Procedure: OPEN REDUCTION INTERNAL FIXATION (ORIF) RIGHT FIFTH METACARPAL FRACTURE;  Surgeon: Dominica Severin, MD;  Location: [facility] OR;  Service: Orthopedics;  Laterality: Right;   ORIF METACARPAL FRACTURE  10/ 2015   FIFTTH METACARPAL OF RIGHT HAND       Review of Systems  Constitutional:  Negative for appetite change, chills, fatigue, fever and unexpected weight change.  HENT:  Negative for mouth sores and sore throat.   Eyes:  Negative for visual disturbance.  Respiratory:  Negative for cough, shortness of breath and wheezing.   Cardiovascular:  Negative for chest pain and palpitations.  Gastrointestinal:  Negative for abdominal pain, blood in stool, diarrhea, nausea, rectal pain and vomiting.  Genitourinary:  Negative for decreased urine volume, difficulty urinating, dysuria, enuresis, flank pain, frequency, genital sores, hematuria, penile discharge, penile pain, penile swelling, scrotal swelling, testicular pain and urgency.  Musculoskeletal:  Negative for arthralgias, back pain and myalgias.  Skin:  Negative for rash.  Allergic/Immunologic: Negative for immunocompromised state.  Neurological:  Negative for weakness and headaches.  Hematological:  Negative for adenopathy.  Psychiatric/Behavioral: Negative.       Objective:    Wt 181 lb (82.1 kg)   BMI 22.62 kg/m  Nursing note and vital signs reviewed.  Physical Exam Vitals reviewed.  Constitutional:      Appearance: Normal appearance.  HENT:     Head: Normocephalic and atraumatic.     Nose: Nose normal.     Mouth/Throat:     Mouth: Mucous membranes are moist.     Pharynx: Oropharynx is  clear. No oropharyngeal exudate or posterior oropharyngeal erythema.  Eyes:     Extraocular Movements: Extraocular movements intact.     Conjunctiva/sclera: Conjunctivae normal.     Pupils: Pupils are equal, round, and reactive to light.  Cardiovascular:     Rate and Rhythm: Normal rate and regular rhythm.  Pulmonary:     Effort: Pulmonary effort is normal.     Breath sounds: Normal breath sounds.  Skin:    General: Skin is warm and dry.  Neurological:     General: No focal deficit present.     Mental Status: He is alert and oriented to person, place, and time.  Psychiatric:        Mood and Affect: Mood normal.        Behavior: Behavior normal.        Thought Content: Thought content normal.        Judgment: Judgment normal.     Depression screen [facility] 2/9 06/22/2021 05/28/2019 01/18/2017 01/18/2017 06/27/2016  Decreased Interest 0 0 0 0 1  Down, Depressed, Hopeless 0 0 0 0 1  PHQ - 2 Score 0 0 0 0 2  Altered sleeping - - - - 0  Tired, decreased energy - - - - 1  Change in appetite - - - - 0  Feeling bad or failure about yourself  - - - - 1  Trouble concentrating - - - - 0  Moving slowly or fidgety/restless - - - - 0  Suicidal thoughts - - - - 0  PHQ-9 Score - - - - 4  Difficult doing work/chores - - - - Not difficult at all       Assessment & Plan:  Exposure to Chlamydia-sent doxy 100 mg bid x 7 days-avoid sexual activity for 2 weeks Encouraged condoms for prevention of STIs STI screening today Empiric treatment of gonorrhea Rocephin 500 mg IM once  HIV-1-labs drawn today -continue Genovoya and follow up with Dr. 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   No orders of the defined types were placed in this encounter.    Follow-up: No follow-ups on file.

## 2021-06-23 LAB — CYTOLOGY, (ORAL, ANAL, URETHRAL) ANCILLARY ONLY
Chlamydia: NEGATIVE
Chlamydia: NEGATIVE
Comment: NEGATIVE
Comment: NEGATIVE
Comment: NORMAL
Comment: NORMAL
Neisseria Gonorrhea: NEGATIVE
Neisseria Gonorrhea: NEGATIVE

## 2021-06-23 LAB — URINE CYTOLOGY ANCILLARY ONLY
Chlamydia: NEGATIVE
Comment: NEGATIVE
Comment: NORMAL
Neisseria Gonorrhea: NEGATIVE

## 2021-06-23 LAB — T-HELPER CELL (CD4) - (RCID CLINIC ONLY)
CD4 % Helper T Cell: 22 % — ABNORMAL LOW (ref 33–65)
CD4 T Cell Abs: 461 /uL (ref 400–1790)

## 2021-06-24 ENCOUNTER — Ambulatory Visit: Payer: Self-pay | Admitting: Physician Assistant

## 2021-06-24 LAB — RPR: RPR Ser Ql: NONREACTIVE

## 2021-06-24 LAB — LIPID PANEL
Cholesterol: 147 mg/dL (ref <200)
HDL: 59 mg/dL (ref >=40)
LDL Cholesterol (Calc): 77 mg/dL (calc)
Non-HDL Cholesterol (Calc): 88 mg/dL (calc) (ref <130)
Total CHOL/HDL Ratio: 2.5 (calc) (ref <5.0)
Triglycerides: 38 mg/dL (ref <150)

## 2021-06-24 LAB — CBC
HCT: 45.1 % (ref 38.5–50.0)
Hemoglobin: 15 g/dL (ref 13.2–17.1)
MCH: 31.6 pg (ref 27.0–33.0)
MCHC: 33.3 g/dL (ref 32.0–36.0)
MCV: 94.9 fL (ref 80.0–100.0)
MPV: 10 fL (ref 7.5–12.5)
Platelets: 221 10*3/uL (ref 140–400)
RBC: 4.75 10*6/uL (ref 4.20–5.80)
RDW: 12.1 % (ref 11.0–15.0)
WBC: 6.4 10*3/uL (ref 3.8–10.8)

## 2021-06-24 LAB — COMPREHENSIVE METABOLIC PANEL
AG Ratio: 1.9 (calc) (ref 1.0–2.5)
ALT: 21 U/L (ref 9–46)
AST: 18 U/L (ref 10–40)
Albumin: 4.5 g/dL (ref 3.6–5.1)
Alkaline phosphatase (APISO): 70 U/L (ref 36–130)
BUN: 13 mg/dL (ref 7–25)
CO2: 29 mmol/L (ref 20–32)
Calcium: 9.3 mg/dL (ref 8.6–10.3)
Chloride: 103 mmol/L (ref 98–110)
Creat: 1.12 mg/dL (ref 0.60–1.24)
Globulin: 2.4 g/dL (calc) (ref 1.9–3.7)
Glucose, Bld: 83 mg/dL (ref 65–99)
Potassium: 4.2 mmol/L (ref 3.5–5.3)
Sodium: 138 mmol/L (ref 135–146)
Total Bilirubin: 0.6 mg/dL (ref 0.2–1.2)
Total Protein: 6.9 g/dL (ref 6.1–8.1)

## 2021-06-24 LAB — HIV-1 RNA QUANT-NO REFLEX-BLD
HIV 1 RNA Quant: 46 Copies/mL — ABNORMAL HIGH
HIV-1 RNA Quant, Log: 1.66 Log cps/mL — ABNORMAL HIGH

## 2021-07-28 ENCOUNTER — Other Ambulatory Visit: Payer: 59

## 2021-08-10 ENCOUNTER — Other Ambulatory Visit: Payer: Self-pay

## 2021-08-10 ENCOUNTER — Ambulatory Visit (INDEPENDENT_AMBULATORY_CARE_PROVIDER_SITE_OTHER): Payer: Self-pay | Admitting: Infectious Diseases

## 2021-08-10 ENCOUNTER — Ambulatory Visit (INDEPENDENT_AMBULATORY_CARE_PROVIDER_SITE_OTHER): Payer: Self-pay

## 2021-08-10 VITALS — BP 113/73 | HR 68 | Resp 16 | Ht 75.0 in | Wt 180.0 lb

## 2021-08-10 DIAGNOSIS — F325 Major depressive disorder, single episode, in full remission: Secondary | ICD-10-CM

## 2021-08-10 DIAGNOSIS — Z113 Encounter for screening for infections with a predominantly sexual mode of transmission: Secondary | ICD-10-CM

## 2021-08-10 DIAGNOSIS — Z23 Encounter for immunization: Secondary | ICD-10-CM

## 2021-08-10 DIAGNOSIS — Z79899 Other long term (current) drug therapy: Secondary | ICD-10-CM

## 2021-08-10 DIAGNOSIS — K603 Anal fistula: Secondary | ICD-10-CM

## 2021-08-10 DIAGNOSIS — B20 Human immunodeficiency virus [HIV] disease: Secondary | ICD-10-CM

## 2021-08-10 NOTE — Progress Notes (Signed)
   Subjective:    Patient ID: Francisco Mitchell, male  DOB: [birth year], 25 y.o.        MRN: [id number]   HPI 25 yo M with newly dx HIV+ on 07-16-15. Seen March 2017 and started on genvoya, bactrim.  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 Had repeat abscess 2020.  Has not had further issues, states it is finished healing. No leakage or drainage. Has yearly f/u in [place].  No probs with ART (genvoya).  He has gotten 3 COVID vax. Has not gotten bivalent yet.  Defers flu vax.   He was seen in October and had repeat testing for STI- all negative. He took 2 weeks of doxy.   Feels good, this is most i've weighed in a long time.  No love life.    HIV 1 RNA Quant  Date Value  06/22/2021 46 Copies/mL (H)  06/09/2020 <20 Copies/mL  05/28/2019 <20 NOT DETECTED copies/mL   CD4 T Cell Abs (/uL)  Date Value  06/22/2021 461  06/09/2020 419  04/16/2018 220 (L)     Health Maintenance  Topic Date Due  . TETANUS/TDAP  08/20/2019  . COVID-19 Vaccine (3 - Pfizer risk series) 01/11/2020  . INFLUENZA VACCINE  04/12/2021  . Pneumococcal Vaccine 20-57 Years old (4 - PPSV23 if available, else PCV20) 09/30/2058  . Hepatitis C Screening  Completed  . HIV Screening  Completed  . HPV VACCINES  Aged Out      Review of Systems  Constitutional:  Negative for chills, fever and weight loss.  Respiratory:  Negative for cough and shortness of breath.   Gastrointestinal:  Negative for constipation and diarrhea.  Genitourinary:  Negative for dysuria.  Psychiatric/Behavioral:  Negative for depression.    Please see HPI. All other systems reviewed and negative.     Objective:  Physical Exam Vitals reviewed.  Constitutional:      Appearance: Normal appearance.  HENT:     Mouth/Throat:     Mouth: Mucous membranes are moist.     Pharynx: No oropharyngeal exudate.  Cardiovascular:     Rate and Rhythm: Normal rate and regular rhythm.  Pulmonary:     Effort:  Pulmonary effort is normal.     Breath sounds: Normal breath sounds.  Abdominal:     General: Bowel sounds are normal.     Palpations: Abdomen is soft.  Musculoskeletal:        General: No swelling. Normal range of motion.     Cervical back: Normal range of motion and neck supple.     Right lower leg: No edema.     Left lower leg: No edema.  Neurological:     General: No focal deficit present.     Mental Status: He is alert.          Assessment & Plan:

## 2021-08-10 NOTE — Progress Notes (Signed)
   Covid-19 Vaccination Clinic  Name:  Francisco Mitchell    MRN: [id number] DOB: 1996-01-25  08/10/2021  Mr. Dollins was observed post Covid-19 immunization for 15 minutes without incident. He was provided with Vaccine Information Sheet and instruction to access the V-Safe system.   Mr. Mazzie was instructed to call 911 with any severe reactions post vaccine: Difficulty breathing  Swelling of face and throat  A fast heartbeat  A bad rash all over body  Dizziness and weakness   Immunizations Administered     Name Date Dose VIS Date Route   Pfizer Covid-19 Vaccine Bivalent Booster 08/10/2021  3:00 PM 0.3 mL 05/12/2021 Intramuscular   Manufacturer: [employer], [employer]   Lot: [id number]   NDC: [address]-5      [place] Artist CMA

## 2021-08-10 NOTE — Assessment & Plan Note (Signed)
Resolved per his hx.  Has yearly f/u in [place].

## 2021-08-10 NOTE — Assessment & Plan Note (Signed)
Given condoms He will take COVID vax if we have. Defers flu Continue genvoya.  Will check his GC/Chlmaydia today at his request.  rtc in 1 year.

## 2021-08-10 NOTE — Assessment & Plan Note (Signed)
States his mood is good today.  No overt signs of depression today.  Will continue to watch.

## 2021-08-11 LAB — URINE CYTOLOGY ANCILLARY ONLY
Chlamydia: NEGATIVE
Comment: NEGATIVE
Comment: NORMAL
Neisseria Gonorrhea: NEGATIVE

## 2021-08-17 ENCOUNTER — Encounter: Payer: 59 | Admitting: Infectious Diseases

## 2021-09-08 ENCOUNTER — Other Ambulatory Visit: Payer: Self-pay | Admitting: Infectious Diseases

## 2021-09-08 DIAGNOSIS — B20 Human immunodeficiency virus [HIV] disease: Secondary | ICD-10-CM

## 2021-11-15 ENCOUNTER — Other Ambulatory Visit (HOSPITAL_COMMUNITY): Payer: Self-pay

## 2021-12-15 ENCOUNTER — Other Ambulatory Visit (HOSPITAL_COMMUNITY): Payer: Self-pay

## 2021-12-15 ENCOUNTER — Other Ambulatory Visit: Payer: Self-pay | Admitting: Pharmacist

## 2021-12-15 DIAGNOSIS — B20 Human immunodeficiency virus [HIV] disease: Secondary | ICD-10-CM

## 2021-12-15 MED ORDER — GENVOYA 150-150-200-10 MG PO TABS: 1.0000 | ORAL_TABLET | Freq: Every day | ORAL | 6 refills | Status: DC

## 2022-02-02 ENCOUNTER — Encounter: Payer: Self-pay | Admitting: Infectious Diseases

## 2022-08-02 ENCOUNTER — Other Ambulatory Visit: Payer: Self-pay | Admitting: Infectious Diseases

## 2022-08-02 ENCOUNTER — Other Ambulatory Visit: Payer: Self-pay

## 2022-08-02 DIAGNOSIS — Z79899 Other long term (current) drug therapy: Secondary | ICD-10-CM

## 2022-08-02 DIAGNOSIS — Z113 Encounter for screening for infections with a predominantly sexual mode of transmission: Secondary | ICD-10-CM

## 2022-08-02 DIAGNOSIS — B20 Human immunodeficiency virus [HIV] disease: Secondary | ICD-10-CM

## 2022-08-02 MED ORDER — GENVOYA 150-150-200-10 MG PO TABS: 1.0000 | ORAL_TABLET | Freq: Every day | ORAL | 0 refills | Status: AC

## 2022-08-02 NOTE — Telephone Encounter (Signed)
Patient due for follow up.

## 2022-08-11 ENCOUNTER — Other Ambulatory Visit: Payer: PRIVATE HEALTH INSURANCE

## 2022-08-18 ENCOUNTER — Ambulatory Visit: Payer: PRIVATE HEALTH INSURANCE | Admitting: Infectious Diseases

## 2022-09-07 ENCOUNTER — Telehealth: Payer: Self-pay

## 2022-09-07 NOTE — Telephone Encounter (Signed)
Patient called office today to discuss steps on transferring care to local provider in [place], [place]. States that he will be working there longer then what he originally had planned.  Advised patient either look online for ID providers to take over care or call local health department for assistance. Understands office will need medical release before medical records could be released.  Patient asked if provider would be able to  send in refills for Genvoya. Informed patient that providers are not able to send in prescriptions outside of [place] lines. Verbalized understanding . Will work on Programmer, multimedia provider. Juanita Laster, RMA
